# Patient Record
Sex: Female | Born: 1965 | Race: White | Hispanic: No | Marital: Single | Smoking: Former smoker
Health system: Southern US, Community
[De-identification: ages and names within clinical notes are randomized; demographics above are authoritative.]

## PROBLEM LIST (undated history)

## (undated) DIAGNOSIS — K449 Diaphragmatic hernia without obstruction or gangrene: Secondary | ICD-10-CM

## (undated) DIAGNOSIS — B029 Zoster without complications: Secondary | ICD-10-CM

## (undated) DIAGNOSIS — B019 Varicella without complication: Secondary | ICD-10-CM

## (undated) DIAGNOSIS — F32A Depression, unspecified: Secondary | ICD-10-CM

## (undated) DIAGNOSIS — G43909 Migraine, unspecified, not intractable, without status migrainosus: Secondary | ICD-10-CM

## (undated) DIAGNOSIS — L409 Psoriasis, unspecified: Secondary | ICD-10-CM

## (undated) DIAGNOSIS — F329 Major depressive disorder, single episode, unspecified: Secondary | ICD-10-CM

## (undated) DIAGNOSIS — S93401A Sprain of unspecified ligament of right ankle, initial encounter: Secondary | ICD-10-CM

## (undated) DIAGNOSIS — Z1239 Encounter for other screening for malignant neoplasm of breast: Secondary | ICD-10-CM

## (undated) DIAGNOSIS — R102 Pelvic and perineal pain: Secondary | ICD-10-CM

## (undated) DIAGNOSIS — Z1231 Encounter for screening mammogram for malignant neoplasm of breast: Principal | ICD-10-CM

## (undated) HISTORY — DX: Varicella without complication: B01.9

## (undated) HISTORY — DX: Zoster without complications: B02.9

## (undated) HISTORY — DX: Depression, unspecified: F32.A

## (undated) HISTORY — DX: Migraine, unspecified, not intractable, without status migrainosus: G43.909

## (undated) HISTORY — DX: Psoriasis, unspecified: L40.9

## (undated) HISTORY — DX: Diaphragmatic hernia without obstruction or gangrene: K44.9

## (undated) HISTORY — DX: Major depressive disorder, single episode, unspecified: F32.9

## (undated) HISTORY — PX: LAPAROSCOPIC GASTRIC BANDING: SHX1100

## (undated) HISTORY — PX: LAPAROSCOPY: SHX197

---

## 2013-09-27 ENCOUNTER — Encounter

## 2013-10-07 ENCOUNTER — Encounter

## 2014-03-13 ENCOUNTER — Inpatient Hospital Stay: Admit: 2014-03-13 | Payer: BLUE CROSS/BLUE SHIELD | Primary: Rheumatology

## 2014-03-13 ENCOUNTER — Encounter

## 2014-03-13 DIAGNOSIS — M7989 Other specified soft tissue disorders: Secondary | ICD-10-CM

## 2014-04-20 ENCOUNTER — Ambulatory Visit
Admit: 2014-04-20 | Discharge: 2014-04-20 | Payer: PRIVATE HEALTH INSURANCE | Attending: Specialist | Primary: Rheumatology

## 2014-04-20 DIAGNOSIS — S6010XA Contusion of unspecified finger with damage to nail, initial encounter: Secondary | ICD-10-CM

## 2014-04-20 MED ORDER — HYDROCODONE-ACETAMINOPHEN 7.5 MG-325 MG TAB
ORAL_TABLET | Freq: Every evening | ORAL | Status: DC | PRN
Start: 2014-04-20 — End: 2014-05-11

## 2014-04-20 NOTE — Progress Notes (Signed)
Patient: Martha PennerMary Porter                MRN: [id number]       SSN: [id number]  Date of Birth: 09/07/1965        AGE: 48 y.o.        SEX: female      PCP: Galen ManilaZachary Adams, MD  04/20/2014    Chief Complaint   Patient presents with   ??? Finger Pain     rt middle finger       HISTORY:  Martha PennerMary Porter is a 48 y.o. female who is seen for right middle finger pain. The injury occurred when she slammed her finger in a car door.  [facility] right [facility].     She has psoriasis but no psoriatic arthritis.    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  She is a history Runner, broadcasting/film/videoteacher.  [place] in [place] friend. She is right handed.   Last 3 Recorded Weights in this Encounter    04/20/14 0954   Weight: 200 lb (90.719 kg)     Body mass index is 36.57 kg/(m^2).    REVIEW OF SYSTEMS: All Below are Negative except: See HPI     Constitutional: negative for fever, chills, and weight loss.   Cardiovascular: negative for chest pain, claudication, leg swelling, SOB, DOE   Gastrointestinal: Negative for pain, N/V/C/D, Blood in stool or urine, dysuria,  hematuria, incontinence, pelvic pain.   Musculoskeletal: See HPI   Neurological: Negative for dizziness and weakness.   Negative for headaches, Visual changes, confusion, seizures   Phychiatric/Behavioral: Negative for depression, memory loss, substance  abuse.    Extremities: Negative for hair changes, rash, or skin lesion changes.   Hematologic: Negative for bleeding problems, bruising, pallor or swollen lymph  nodes   Peripheral Vascular: No calf pain, no circulation deficits.    History     Social History   ??? Marital Status: DIVORCED     Spouse Name: N/A     Number of Children: N/A   ??? Years of Education: N/A     Occupational History   ??? Not on file.     Social History Main Topics   ??? Smoking status: Former Smoker   ??? Smokeless tobacco: Not on file   ??? Alcohol Use: Yes   ??? Drug Use: Not on file    ??? Sexual Activity: Not on file     Other Topics Concern   ??? Not on file     Social History Narrative   ??? No narrative on file        Not on File     Current Outpatient Prescriptions   Medication Sig   ??? lisinopril (PRINIVIL, ZESTRIL) 20 mg tablet Take  by mouth daily.   ??? escitalopram oxalate (LEXAPRO) 20 mg tablet Take 20 mg by mouth daily.   ??? HYDROcodone-acetaminophen (NORCO) 7.5-325 mg per tablet Take 1-2 Tabs by mouth nightly as needed for Pain. Max Daily Amount: 2 Tabs.     No current facility-administered medications for this visit.        PHYSICAL EXAMINATION:  BP 134/74 mmHg   Pulse 66   Temp(Src) 97.1 ??F (36.2 ??C) (Oral)   Ht 5\' 2"  (1.575 m)   Wt 200 lb (90.719 kg)   BMI 36.57 kg/m2     ORTHO EXAMINATION:  Examination Right Hand   Skin Bruising  of the tip of the middle finger   Deformity -   Swelling -   Tenderness -   Finger flexion Full   Finger extension Full   Sensation Normal   Capillary refill -   Heberden's nodes -   Dupuytren's -       RADIOGRAPHS: Distal phalanx fracture of the right middle finger 1/3 distal portion of the DIP joint.     IMPRESSION:      ICD-10-CM ICD-9-CM    1. Subungual hematoma of finger of right hand, initial encounter S60.10XA 923.3 HYDROcodone-acetaminophen (NORCO) 7.5-325 mg per tablet    middle    2. Mallet deformity of third finger, right M20.011 736.1 HYDROcodone-acetaminophen (NORCO) 7.5-325 mg per tablet   3. Fracture of finger, distal phalanx, right, closed, initial encounter S62.639A 816.02 HYDROcodone-acetaminophen (NORCO) 7.5-325 mg per tablet       PLAN:  She will take Norco at night for the pain and will receive two link splints for her right middle finger bony mallet finger injury.  I will see her back in one month with re x ray.    Scribed by Beverly SessionsAllison Shaffer (Scribekick) as dictated by Tonia BroomsSteven C. Blake Vetrano, MD

## 2014-04-20 NOTE — Patient Instructions (Signed)
Mallet Finger: After Your Visit  Your Care Instructions  Mallet finger is a bent fingertip. It is caused by a fractured bone or torn tendon at the base of the finger joint near the fingertip. The injury can happen when a finger is bent with force, such as when trying to catch a ball and the fingertip is struck by the ball. It also is called baseball finger or drop finger.  Treatment includes wearing a splint for several weeks to keep the finger straight. Surgery may be needed if pieces of bone break off during the injury or if treatment with the splint does not work. Usually only a splint is needed.  It is very important that you wear and take care of the splint exactly as your doctor tells you to so that your finger heals properly and is no longer bent. Wearing a splint may interfere with your normal activities. Ask for help with daily tasks if you need it.  Follow-up care is a key part of your treatment and safety. Be sure to make and go to all appointments, and call your doctor if you are having problems. It's also a good idea to know your test results and keep a list of the medicines you take.  How can you care for yourself at home?  ?? If your doctor put a splint on your finger, wear the splint exactly as directed. Do not remove it until your doctor says that you can.  ?? Keep your hand raised above the level of your heart as much as you can. This will help reduce swelling.  ?? Put ice or a cold pack on your finger for 10 to 20 minutes at a time. Try to do this every 1 to 2 hours for the next 3 days (when you are awake) or until the swelling goes down. Put a thin cloth between the ice and your skin. Keep the splint dry.  ?? Take pain medicines exactly as directed.  ?? If the doctor gave you a prescription medicine for pain, take it as prescribed.  ?? If you are not taking a prescription pain medicine, ask your doctor if you can take an over-the-counter medicine.  When should you call for help?   Call your doctor now or seek immediate medical care if:  ?? Your finger is cool or pale or changes color.  ?? Your pain gets much worse.  ?? You have tingling or numbness in your finger.  ?? You have signs of infection. These include:  ?? Increased pain, swelling, warmth, or redness.  ?? Red streaks leading from the area.  ?? Pus draining from the area.  ?? Swollen lymph nodes in the armpits.  ?? A fever.  Watch closely for changes in your health, and be sure to contact your doctor if:  ?? You have more pain and swelling than your doctor told you to expect.   Where can you learn more?   Go to [email or web address]://www.healthwise.net/[email or web address]  Enter [phone] [id number] in the search box to learn more about "Mallet Finger: After Your Visit."   ?? 2006-2015 Healthwise, Incorporated. Care instructions adapted under license by [facility] [employer] (which disclaims liability or warranty for this information). This care instruction is for use with your licensed healthcare professional. If you have questions about a medical condition or this instruction, always ask your healthcare professional. Healthwise, Incorporated disclaims any warranty or liability for your use of this information.  Content Version: 10.5.422740; Current as of: March 25, 2013

## 2014-04-20 NOTE — Progress Notes (Signed)
Chief Complaint   Patient presents with   ??? Finger Pain     rt middle finger   pain 6/10

## 2014-05-11 ENCOUNTER — Ambulatory Visit
Admit: 2014-05-11 | Discharge: 2014-05-11 | Payer: PRIVATE HEALTH INSURANCE | Attending: Surgical | Primary: Rheumatology

## 2014-05-11 DIAGNOSIS — M79644 Pain in right finger(s): Secondary | ICD-10-CM

## 2014-05-11 MED ORDER — HYDROCODONE-ACETAMINOPHEN 7.5 MG-325 MG TAB
ORAL_TABLET | Freq: Every evening | ORAL | Status: AC | PRN
Start: 2014-05-11 — End: ?

## 2014-05-11 NOTE — Progress Notes (Signed)
Patient: Martha Porter                MRN: [id number]       SSN: [id number]  Date of Birth: 08/30/1965        AGE: 48 y.o.        SEX: female      PCP: Galen ManilaZachary Adams, MD  05/11/2014    Chief Complaint   Patient presents with   ??? Hand Pain     Right       HISTORY:  Martha PennerMary Porter is a 48 y.o. female who is seen for right middle finger pain. The injury occurred when she slammed her finger in a car door.  [facility].     She has psoriasis but no psoriatic arthritis.    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  [place] in [place] a friend. She is right handed.   Last 3 Recorded Weights in this Encounter    05/11/14 0929   Weight: 200 lb (90.719 kg)     Body mass index is 36.57 kg/(m^2).    REVIEW OF SYSTEMS: All Below are Negative except: See HPI     Constitutional: negative for fever, chills, and weight loss.   Cardiovascular: negative for chest pain, claudication, leg swelling, SOB, DOE   Gastrointestinal: Negative for pain, N/V/C/D, Blood in stool or urine, dysuria,  hematuria, incontinence, pelvic pain.   Musculoskeletal: See HPI   Neurological: Negative for dizziness and weakness.   Negative for headaches, Visual changes, confusion, seizures   Phychiatric/Behavioral: Negative for depression, memory loss, substance  abuse.    Extremities: Negative for hair changes, rash, or skin lesion changes.   Hematologic: Negative for bleeding problems, bruising, pallor or swollen lymph  nodes   Peripheral Vascular: No calf pain, no circulation deficits.    History     Social History   ??? Marital Status: DIVORCED     Spouse Name: N/A     Number of Children: N/A   ??? Years of Education: N/A     Occupational History   ??? Not on file.     Social History Main Topics   ??? Smoking status: Former Smoker   ??? Smokeless tobacco: Not on file   ??? Alcohol Use: Yes   ??? Drug Use: Not on file    ??? Sexual Activity: Not on file     Other Topics Concern   ??? Not on file     Social History Narrative        No Known Allergies     Current Outpatient Prescriptions   Medication Sig   ??? HYDROcodone-acetaminophen (NORCO) 7.5-325 mg per tablet Take 1-2 Tabs by mouth nightly as needed for Pain. Max Daily Amount: 2 Tabs.   ??? lisinopril (PRINIVIL, ZESTRIL) 20 mg tablet Take  by mouth daily.   ??? escitalopram oxalate (LEXAPRO) 20 mg tablet Take 20 mg by mouth daily.     No current facility-administered medications for this visit.        PHYSICAL EXAMINATION:  See below     ORTHO EXAMINATION:  Examination Right Hand (long finger)   Skin Bruising of the tip of the middle finger at dip with dry skin noted dorsally   Deformity -   Swelling -   Tenderness -   Finger flexion (P) at IP 70 degrees and 0 at DIP  Finger extension Full   Sensation Normal   Capillary refill -   Heberden's nodes -   Dupuytren's -       RADIOGRAPHS: Distal phalanx fracture of the right middle finger 1/3 distal portion of the DIP joint.     IMPRESSION:      ICD-10-CM ICD-9-CM    1. Pain in finger of right hand M79.644 729.5 AMB POC XRAY, HAND; 3+ VIEWS   2. Subungual hematoma of finger of right hand, initial encounter S60.10XA 923.3 HYDROcodone-acetaminophen (NORCO) 7.5-325 mg per tablet    middle    3. Mallet deformity of third finger, right M20.011 736.1 HYDROcodone-acetaminophen (NORCO) 7.5-325 mg per tablet   4. Fracture of finger, distal phalanx, right, closed, initial encounter S62.639A 816.02 HYDROcodone-acetaminophen (NORCO) 7.5-325 mg per tablet       PLAN:  Continue Norco and link splint x 3 weeks

## 2014-05-26 ENCOUNTER — Encounter: Attending: Surgical | Primary: Rheumatology

## 2014-09-06 ENCOUNTER — Encounter: Payer: Self-pay | Admitting: Family

## 2014-09-06 ENCOUNTER — Ambulatory Visit (INDEPENDENT_AMBULATORY_CARE_PROVIDER_SITE_OTHER): Payer: BC Managed Care – PPO | Admitting: Family

## 2014-09-06 VITALS — BP 138/94 | HR 82 | Temp 98.6°F | Resp 18 | Ht 62.0 in | Wt 201.0 lb

## 2014-09-06 DIAGNOSIS — F329 Major depressive disorder, single episode, unspecified: Secondary | ICD-10-CM

## 2014-09-06 DIAGNOSIS — I10 Essential (primary) hypertension: Secondary | ICD-10-CM | POA: Diagnosis not present

## 2014-09-06 DIAGNOSIS — R350 Frequency of micturition: Secondary | ICD-10-CM

## 2014-09-06 DIAGNOSIS — F32A Depression, unspecified: Secondary | ICD-10-CM | POA: Insufficient documentation

## 2014-09-06 LAB — POCT URINALYSIS DIPSTICK
Bilirubin, UA: NEGATIVE
Blood, UA: NEGATIVE
GLUCOSE UA: NEGATIVE
Ketones, UA: NEGATIVE
Leukocytes, UA: NEGATIVE
Nitrite, UA: NEGATIVE
Protein, UA: NEGATIVE
SPEC GRAV UA: 1.025
UROBILINOGEN UA: NEGATIVE
pH, UA: 6

## 2014-09-06 MED ORDER — MIRABEGRON ER 25 MG PO TB24: 25.0000 mg | ORAL_TABLET | Freq: Every day | ORAL | Status: DC

## 2014-09-06 NOTE — Patient Instructions (Signed)
Thank you for choosing [facility].  Summary/Instructions:  Your prescription(s) have been submitted to your pharmacy or been printed and provided for you. Please take as directed and contact our office if you believe you are having problem(s) with the medication(s) or have any questions.  Please stop by the lab on the basement level of the building for your blood work. Your results will be released to MyChart (or called to you) after review, usually within 72 hours after test completion. If any changes need to be made, you will be notified at that same time.  If your symptoms worsen or fail to improve, please contact our office for further instruction, or in case of emergency go directly to the emergency room at the closest medical facility.    Overactive Bladder The bladder has two functions that are totally opposite of the other. One is to relax and stretch out so it can store urine (fills like a balloon), and the other is to contract and squeeze down so that it can empty the urine that it has stored. Proper functioning of the bladder is a complex mixing of these two functions. The filling and emptying of the bladder can be influenced by: 1. The bladder. 2. The spinal cord. 3. The brain. 4. The nerves going to the bladder. 5. Other organs that are closely related to the bladder such as prostate in males and the vagina in females. As your bladder fills with urine, nerve signals are sent from the bladder to the brain to tell you that you may need to urinate. Normal urination requires that the bladder squeeze down with sufficient strength to empty the bladder, but this also requires that the bladder squeeze down sufficiently long to finish the job. In addition the sphincter muscles, which normally keep you from leaking urine, must also relax so that the urine can pass. Coordination between the bladder muscle squeezing down and the sphincter muscles relaxing is required to make everything  happen normally. With an overactive bladder sometimes the muscles of the bladder contract unexpectedly and involuntarily and this causes an urgent need to urinate. The normal response is to try to hold urine in by contracting the sphincter muscles. Sometimes the bladder contracts so strongly that the sphincter muscles cannot stop the urine from passing out and incontinence occurs. This kind of incontinence is called urge incontinence. Having an overactive bladder can be embarrassing and awkward. It can keep you from living life the way you want to. Many people think it is just something you have to put up with as you grow older or have certain health conditions. In fact, there are treatments that can help make your life easier and more pleasant. CAUSES  Many things can cause an overactive bladder. Possibilities include:  Urinary tract infection or infection of nearby tissues such as the prostate.  Prostate enlargement.  In women, multiple pregnancies or surgery on the uterus or urethra.  Bladder stones, inflammation, or tumors.  Caffeine.  Alcohol.  Medications. For example, diuretics (drugs that help the body get rid of extra fluid) increase urine production. Some other medicines must be taken with lots of fluids.  Muscle or nerve weakness. This might be the result of a spinal cord injury, a stroke, multiple sclerosis, or Parkinson disease.  Diabetes can cause a high urine volume which fills the bladder so quickly that the normal urge to urinate is triggered very strongly. SYMPTOMS   Loss of bladder control. You feel the need to urinate and  cannot make your body wait.  Sudden, strong urges to urinate.  Urinating 8 or more times a day.  Waking up to urinate two or more times a night. DIAGNOSIS  To decide if you have overactive bladder, your health care provider will probably:  Ask about symptoms you have noticed.  Ask about your overall health. This will include questions about  any medications you are taking.  Do a physical examination. This will help determine if there are obvious blockages or other problems.  Order some tests. These might include:  A blood test to check for diabetes or other health issues that could be contributing to the problem.  Urine testing. This could measure the flow of urine and the pressure on the bladder.  A test of your neurological system (the brain, spinal cord, and nerves). This is the system that senses the need to urinate. Some of these tests are called flow tests, bladder pressure tests, and electrical measurements of the sphincter muscle.  A bladder test to check whether it is emptying completely when you urinate.  Cystoscopy. This test uses a thin tube with a tiny camera on it. It offers a look inside your urethra and bladder to see if there are problems.  Imaging tests. You might be given a contrast dye and then asked to urinate. X-rays are taken to see how your bladder is working. TREATMENT  An overactive bladder can be treated in many ways. The treatment will depend on the cause. Whether you have a mild or severe case also makes a difference. Often, treatment can be given in your health care provider's office or clinic. Be sure to discuss the different options with your caregiver. They include:  Behavioral treatments. These do not involve medication or surgery:  Bladder training. For this, you would follow a schedule to urinate at regular intervals. This helps you learn to control the urge to urinate. At first, you might be asked to wait a few minutes after feeling the urge. In time, you should be able to schedule bathroom visits an hour or more apart.  Kegel exercises. These exercises strengthen the pelvic floor muscles, which support the bladder. Toning these muscles can help control urination even if the bladder muscles are overactive. A specialist will teach you how to do these exercises correctly. They will require daily  practice.  Weight loss. If you are obese or overweight, losing weight might stop your bladder from being overactive. Talk to your health care provider about how many pounds you should lose. Also ask if there is a specific program or method that would work best for you.  Diet change. This might be suggested if constipation is making your overactive bladder worse. Your health care provider or a nutritionist can explain ways to change what you eat to ease constipation. Other people might need to take in less caffeine or alcohol. Sometimes drinking fewer fluids is needed, too.  Protection. This is not an actual treatment. But, you could wear special pads to take care of any leakage while you wait for other treatments to take effect. This will help you avoid embarrassment.  Physical treatments.  Electrical stimulation. Electrodes will send gentle pulses to the nerves or muscles that help control the bladder. The goal is to strengthen them. Sometimes this is done with the electrodes outside the body. Or, they might be placed inside the body (implanted). This treatment can take several months to have an effect.  Medications. These are usually used along with other  treatments. Several medicines are available. Some are injected into the muscles involved in urination. Others come in pill form. Medications sometimes prescribed include:  Anticholinergics. These drugs block the signals that the nerves deliver to the bladder. This keeps it from releasing urine at the wrong time. Researchers think the drugs might help in other ways, too.  Imipramine. This is an antidepressant. But, it relaxes bladder muscles.  Botox. This is still experimental. Some people believe that injecting it into the bladder muscles will relax them so they work more normally. It has also been injected into the sphincter muscle when the sphincter muscle does not open properly. This is a temporary fix, however. Also, it might make matters  worse, especially in older people.  Surgery.  A device might be implanted to help manage your nerves. It works on the nerves that signal when you need to urinate.  Surgery is sometimes needed with electrical stimulation. If the electrodes are implanted, this is done through surgery.  Sometimes repairs need to be made through surgery. For example, the size of the bladder can be changed. This is usually done in severe cases only. HOME CARE INSTRUCTIONS   Take any medications your health care provider prescribed or suggested. Follow the directions carefully.  Practice any lifestyle changes that are recommended. These might include:  Drinking less fluid or drinking at different times of the day. If you need to urinate often during the night, for example, you may need to stop drinking fluids early in the evening.  Cutting down on caffeine or alcohol. They can both make an overactive bladder worse. Caffeine is found in coffee, tea, and sodas.  Doing Kegel exercises to strengthen muscles.  Losing weight, if that is recommended.  Eating a healthy and balanced diet. This will help you avoid constipation.  Keep a journal or a log. You might be asked to record how much you drink and when, and also when you feel the need to urinate.  Learn how to care for implants or other devices, such as pessaries. SEEK MEDICAL CARE IF:   Your overactive bladder gets worse.  You feel increased pain or irritation when you urinate.  You notice blood in your urine.  You have questions about any medications or devices that your health care provider recommended.  You notice blood, pus, or swelling at the site of any test or treatment procedure.  You have an oral temperature above 102F (38.9C). SEEK IMMEDIATE MEDICAL CARE IF:  You have an oral temperature above 102F (38.9C), not controlled by medicine. Document Released: 02/22/2009 Document Revised: 09/12/2013 Document Reviewed: 02/22/2009 [facility]  Patient Information 2015 [place], [place]. This information is not intended to replace advice given to you by your health care provider. Make sure you discuss any questions you have with your health care provider.  Kegel Exercises The goal of Kegel exercises is to isolate and exercise your pelvic floor muscles. These muscles act as a hammock that supports the rectum, vagina, small intestine, and uterus. As the muscles weaken, the hammock sags and these organs are displaced from their normal positions. Kegel exercises can strengthen your pelvic floor muscles and help you to improve bladder and bowel control, improve sexual response, and help reduce many problems and some discomfort during pregnancy. Kegel exercises can be done anywhere and at any time. HOW TO PERFORM KEGEL EXERCISES 6. Locate your pelvic floor muscles. To do this, squeeze (contract) the muscles that you use when you try to stop the flow of urine.  You will feel a tightness in the vaginal area (women) and a tight lift in the rectal area (men and women). 7. When you begin, contract your pelvic muscles tight for 2-5 seconds, then relax them for 2-5 seconds. This is one set. Do 4-5 sets with a short pause in between. 8. Contract your pelvic muscles for 8-10 seconds, then relax them for 8-10 seconds. Do 4-5 sets. If you cannot contract your pelvic muscles for 8-10 seconds, try 5-7 seconds and work your way up to 8-10 seconds. Your goal is 4-5 sets of 10 contractions each day. Keep your stomach, buttocks, and legs relaxed during the exercises. Perform sets of both short and long contractions. Vary your positions. Perform these contractions 3-4 times per day. Perform sets while you are:   Lying in bed in the morning.  Standing at lunch.  Sitting in the late afternoon.  Lying in bed at night. You should do 40-50 contractions per day. Do not perform more Kegel exercises per day than recommended. Overexercising can cause muscle fatigue.  Continue these exercises for for at least 15-20 weeks or as directed by your caregiver. Document Released: 04/14/2012 Document Reviewed: 04/14/2012 [facility] Patient Information 2015 [place]. This information is not intended to replace advice given to you by your health care provider. Make sure you discuss any questions you have with your health care provider.

## 2014-09-06 NOTE — Addendum Note (Signed)
Addended by: Mercer PodWRENN, Autry Prust E on: 09/06/2014 01:22 PM   Modules accepted: Orders

## 2014-09-06 NOTE — Assessment & Plan Note (Signed)
Stable with current regimen of Lexapro. Continue current dosage of the Lexapro.

## 2014-09-06 NOTE — Assessment & Plan Note (Signed)
Symptomatic exam consistent with potential overactive bladder. Obtain UA. In office results are negative for nitrites, leukocytes and hematuria. Obtain A1c to rule out diabetes. Start Myrbetriq. Discussed Kegel exercises to improve stress incontinence. Information provided regarding stress incontinence. Follow-up in one month to determine effectiveness.

## 2014-09-06 NOTE — Progress Notes (Signed)
Pre visit review using our clinic review tool, if applicable. No additional management support is needed unless otherwise documented below in the visit note. 

## 2014-09-06 NOTE — Progress Notes (Signed)
Subjective:    Patient ID: Caitlin Lane, female    DOB: 1966-03-10, 49 y.o.   MRN: [id number]  Chief Complaint  Patient presents with  . Establish Care    having problems with urinary frequency and urgency, does have some leaking, x1 year    HPI:  Caitlin Lane is a 49 y.o. female with a PMH of hypertension, depression and urinary frequency who presents today for an office visit to establish care.   1) Urinary issues - Associated symptoms of urinary frequency, urgency and some stress incontinence has been going on for about a year. Denies any current dysuria or back pain.  Denies any modifying factors that make it better. States she voids multiple times per day up to about every 30 minutes. She has tried not drinking water to prevent going to the restroom. Her caffeine intake is reported to be minimal. She does note there is also minor leakage during sexual intercourse on occasion.   2) Depression - currently maintained and stable with lexapro. Denies any adverse effects of medications. Denies any suicidal ideations.   3) Hypertension - currently maintained on lisinopril. Indicates her blood pressure readings at home are good.   BP Readings from Last 3 Encounters:  09/06/14 138/94    No Known Allergies  No current outpatient prescriptions on file prior to visit.   No current facility-administered medications on file prior to visit.    Past Medical History  Diagnosis Date  . Hypertension   . Depression   . Psoriasis   . Chicken pox   . Shingles   . Migraine   . Urinary incontinence   . UTI (lower urinary tract infection)     Past Surgical History  Procedure Laterality Date  . Cesarean section    . Laparoscopy    . Laparoscopic gastric banding      Family History  Problem Relation Age of Onset  . Arthritis Mother   . Heart disease Mother   . Hypertension Mother   . Depression Mother   . Diabetes Mother   . Arthritis Father   . Lung cancer Father   . Heart disease  Father   . Diabetes Maternal Grandmother   . Diabetes Maternal Grandfather   . Healthy Paternal Grandmother   . Healthy Paternal Grandfather     History   Social History  . Marital Status: Single    Spouse Name: N/A  . Number of Children: 2  . Years of Education: 18+   Occupational History  . Teacher    Social History Main Topics  . Smoking status: Former Games developer  . Smokeless tobacco: Never Used  . Alcohol Use: 0.0 oz/week    0 Standard drinks or equivalent per week     Comment: occasional   . Drug Use: No  . Sexual Activity: Not on file   Other Topics Concern  . Not on file   Social History Narrative  . No narrative on file    Review of Systems  Constitutional: Negative for fever and chills.  Genitourinary: Positive for urgency and frequency. Negative for dysuria, hematuria and flank pain.  Psychiatric/Behavioral: Negative for suicidal ideas and dysphoric mood.      Objective:    BP 138/94 mmHg  Pulse 82  Temp(Src) 98.6 F (37 C) (Oral)  Resp 18  Ht  (1.575 m)  Wt 201 lb (91.173 kg)  BMI 36.75 kg/m2  SpO2 97% Nursing note and vital signs reviewed.  Physical Exam  Constitutional:  She is oriented to person, place, and time. She appears well-developed and well-nourished. No distress.  Cardiovascular: Normal rate, regular rhythm, normal heart sounds and intact distal pulses.   Pulmonary/Chest: Effort normal and breath sounds normal.  Neurological: She is alert and oriented to person, place, and time.  Skin: Skin is warm and dry.  Psychiatric: She has a normal mood and affect. Her behavior is normal. Judgment and thought content normal.       Assessment & Plan:

## 2014-09-06 NOTE — Assessment & Plan Note (Signed)
Blood pressure slightly elevated today over goal of 140/90. Patient indicates home readings are much lower usually. Continue current regimen and dosage of lisinopril.

## 2014-09-20 ENCOUNTER — Emergency Department (HOSPITAL_COMMUNITY)
Admission: EM | Admit: 2014-09-20 | Discharge: 2014-09-20 | Disposition: A | Discharge status: Home / Self Care | Payer: BC Managed Care – PPO | Attending: Emergency Medicine | Admitting: Emergency Medicine

## 2014-09-20 ENCOUNTER — Encounter (HOSPITAL_COMMUNITY): Payer: Self-pay | Admitting: *Deleted

## 2014-09-20 ENCOUNTER — Emergency Department (HOSPITAL_COMMUNITY): Payer: BC Managed Care – PPO

## 2014-09-20 DIAGNOSIS — Z87440 Personal history of urinary (tract) infections: Secondary | ICD-10-CM | POA: Diagnosis not present

## 2014-09-20 DIAGNOSIS — R05 Cough: Secondary | ICD-10-CM | POA: Diagnosis present

## 2014-09-20 DIAGNOSIS — Z872 Personal history of diseases of the skin and subcutaneous tissue: Secondary | ICD-10-CM | POA: Insufficient documentation

## 2014-09-20 DIAGNOSIS — F329 Major depressive disorder, single episode, unspecified: Secondary | ICD-10-CM | POA: Insufficient documentation

## 2014-09-20 DIAGNOSIS — J209 Acute bronchitis, unspecified: Secondary | ICD-10-CM | POA: Insufficient documentation

## 2014-09-20 DIAGNOSIS — Z87891 Personal history of nicotine dependence: Secondary | ICD-10-CM | POA: Insufficient documentation

## 2014-09-20 DIAGNOSIS — J40 Bronchitis, not specified as acute or chronic: Secondary | ICD-10-CM

## 2014-09-20 DIAGNOSIS — Z8659 Personal history of other mental and behavioral disorders: Secondary | ICD-10-CM | POA: Insufficient documentation

## 2014-09-20 DIAGNOSIS — I10 Essential (primary) hypertension: Secondary | ICD-10-CM | POA: Insufficient documentation

## 2014-09-20 DIAGNOSIS — Z8619 Personal history of other infectious and parasitic diseases: Secondary | ICD-10-CM | POA: Insufficient documentation

## 2014-09-20 DIAGNOSIS — Z3202 Encounter for pregnancy test, result negative: Secondary | ICD-10-CM | POA: Diagnosis not present

## 2014-09-20 DIAGNOSIS — G43909 Migraine, unspecified, not intractable, without status migrainosus: Secondary | ICD-10-CM | POA: Diagnosis not present

## 2014-09-20 LAB — CBC
HCT: 40.7 % (ref 36.0–46.0)
HEMOGLOBIN: 13.8 g/dL (ref 12.0–15.0)
MCH: 28.6 pg (ref 26.0–34.0)
MCHC: 33.9 g/dL (ref 30.0–36.0)
MCV: 84.3 fL (ref 78.0–100.0)
Platelets: 394 10*3/uL (ref 150–400)
RBC: 4.83 MIL/uL (ref 3.87–5.11)
RDW: 12.7 % (ref 11.5–15.5)
WBC: 9.9 10*3/uL (ref 4.0–10.5)

## 2014-09-20 LAB — BASIC METABOLIC PANEL
Anion gap: 8 (ref 5–15)
BUN: <5 mg/dL — ABNORMAL LOW (ref 6–20)
CHLORIDE: 104 mmol/L (ref 101–111)
CO2: 24 mmol/L (ref 22–32)
CREATININE: 0.96 mg/dL (ref 0.44–1.00)
Calcium: 9 mg/dL (ref 8.9–10.3)
GFR calc Af Amer: >60 mL/min (ref >60)
GFR calc non Af Amer: >60 mL/min (ref >60)
GLUCOSE: 125 mg/dL — AB (ref 70–99)
Potassium: 3 mmol/L — ABNORMAL LOW (ref 3.5–5.1)
Sodium: 136 mmol/L (ref 135–145)

## 2014-09-20 LAB — I-STAT TROPONIN, ED: Troponin i, poc: 0 ng/mL (ref 0.00–0.08)

## 2014-09-20 LAB — POC URINE PREG, ED: PREG TEST UR: NEGATIVE

## 2014-09-20 MED ORDER — SODIUM CHLORIDE 0.9 % IV BOLUS (SEPSIS)
1000.0000 mL | Freq: Once | INTRAVENOUS | Status: AC
  Administered 2014-09-20: 1000 mL via INTRAVENOUS

## 2014-09-20 MED ORDER — HYDROCODONE-HOMATROPINE 5-1.5 MG/5ML PO SYRP
5.0000 mL | ORAL_SOLUTION | Freq: Once | ORAL | Status: AC
Start: 2014-09-20 — End: 2014-09-20
  Administered 2014-09-20: 5 mL via ORAL
  Filled 2014-09-20: qty 5

## 2014-09-20 MED ORDER — IOHEXOL 350 MG/ML SOLN
100.0000 mL | Freq: Once | INTRAVENOUS | Status: AC | PRN
  Administered 2014-09-20: 100 mL via INTRAVENOUS

## 2014-09-20 MED ORDER — PREDNISONE 20 MG PO TABS
60.0000 mg | ORAL_TABLET | ORAL | Status: AC
  Administered 2014-09-20: 60 mg via ORAL
  Filled 2014-09-20: qty 3

## 2014-09-20 MED ORDER — SULFAMETHOXAZOLE-TRIMETHOPRIM 800-160 MG PO TABS: 1.0000 | ORAL_TABLET | Freq: Two times a day (BID) | ORAL | Status: AC

## 2014-09-20 MED ORDER — PREDNISONE 20 MG PO TABS: 60.0000 mg | ORAL_TABLET | Freq: Every day | ORAL | Status: AC

## 2014-09-20 MED ORDER — HYDROCODONE-HOMATROPINE 5-1.5 MG/5ML PO SYRP: 5.0000 mL | ORAL_SOLUTION | ORAL | Status: DC | PRN

## 2014-09-20 MED ORDER — ALBUTEROL SULFATE HFA 108 (90 BASE) MCG/ACT IN AERS
2.0000 | INHALATION_SPRAY | RESPIRATORY_TRACT | Status: DC
  Administered 2014-09-20: 2 via RESPIRATORY_TRACT
  Filled 2014-09-20: qty 6.7

## 2014-09-20 MED ORDER — SULFAMETHOXAZOLE-TRIMETHOPRIM 800-160 MG PO TABS
1.0000 | ORAL_TABLET | Freq: Once | ORAL | Status: AC
  Administered 2014-09-20: 1 via ORAL
  Filled 2014-09-20: qty 1

## 2014-09-20 NOTE — ED Notes (Signed)
Patient at CT at this time. 

## 2014-09-20 NOTE — ED Notes (Signed)
Pt states that she has had cough and back pain for 3 weeks. Pt states that she has been on antibiotics and mucous is now clear. Pt sates that she is tender to touch in left chest and hurts to cough.

## 2014-09-20 NOTE — Discharge Instructions (Signed)
As discussed, it is important that you follow up as soon as possible with your physician for continued management of your condition.  For the next 2 days, please use the provided albuterol inhaler every 4 hours, and in addition to the prescribed medication.  If you develop any new, or concerning changes in your condition, please return to the emergency department immediately.

## 2014-09-20 NOTE — ED Provider Notes (Signed)
CSN: [id number]     Arrival date & time 09/20/14  1434 History   First MD Initiated Contact with Patient 09/20/14 1853     Chief Complaint  Patient presents with  . Cough     (Consider location/radiation/quality/duration/timing/severity/associated sxs/prior Treatment) HPI Patient presents with concern of ongoing cough, chest pain. Cough began several weeks ago, has not improved in spite of using OTC analgesics, antibiotics. Over the past 2 days patient has also developed new left-sided chest pain, worse with inspiration, positioning. Pain is sharp, severe, nonradiating. No other chest pain, no nausea, vomiting, fever. Patient is generally well aside from history of hypertension. Patient was previously a smoker.  Past Medical History  Diagnosis Date  . Hypertension   . Depression   . Psoriasis   . Chicken pox   . Shingles   . Migraine   . Urinary incontinence   . UTI (lower urinary tract infection)    Past Surgical History  Procedure Laterality Date  . Cesarean section    . Laparoscopy    . Laparoscopic gastric banding     Family History  Problem Relation Age of Onset  . Arthritis Mother   . Heart disease Mother   . Hypertension Mother   . Depression Mother   . Diabetes Mother   . Arthritis Father   . Lung cancer Father   . Heart disease Father   . Diabetes Maternal Grandmother   . Diabetes Maternal Grandfather   . Healthy Paternal Grandmother   . Healthy Paternal Grandfather    History  Substance Use Topics  . Smoking status: Former Games developermoker  . Smokeless tobacco: Never Used  . Alcohol Use: 0.0 oz/week    0 Standard drinks or equivalent per week     Comment: occasional    OB History    No data available     Review of Systems  Constitutional:       Per HPI, otherwise negative  HENT:       Per HPI, otherwise negative  Respiratory:       Per HPI, otherwise negative  Cardiovascular:       Per HPI, otherwise negative  Gastrointestinal: Negative for  vomiting.  Endocrine:       Negative aside from HPI  Genitourinary:       Neg aside from HPI   Musculoskeletal:       Per HPI, otherwise negative  Skin: Negative.   Neurological: Negative for syncope.      Allergies  Review of patient's allergies indicates no known allergies.  Home Medications   Prior to Admission medications   Medication Sig Start Date End Date Taking? Authorizing Provider  calcipotriene-betamethasone (TACLONEX) ointment Apply 1 application topically daily.   Yes Historical Provider, MD  escitalopram (LEXAPRO) 20 MG tablet Take 20 mg by mouth daily.   Yes Historical Provider, MD  lisinopril (PRINIVIL,ZESTRIL) 10 MG tablet Take 10 mg by mouth daily.   Yes Historical Provider, MD  mirabegron ER (MYRBETRIQ) 25 MG TB24 tablet Take 1 tablet (25 mg total) by mouth daily. 09/06/14  Yes Veryl SpeakGregory D Calone, FNP   BP 122/81 mmHg  Pulse 81  Temp(Src) 98 F (36.7 C) (Oral)  Resp 19  SpO2 99%  LMP 09/14/2014 Physical Exam  Constitutional: She is oriented to person, place, and time. She appears well-developed and well-nourished. No distress.  HENT:  Head: Normocephalic and atraumatic.  Eyes: Conjunctivae and EOM are normal.  Cardiovascular: Normal rate and regular rhythm.   Pulmonary/Chest: Effort  normal and breath sounds normal. No stridor. No respiratory distress.  Abdominal: She exhibits no distension.  Musculoskeletal: She exhibits no edema.  Neurological: She is alert and oriented to person, place, and time. No cranial nerve deficit.  Skin: Skin is warm and dry.  Psychiatric: She has a normal mood and affect.  Nursing note and vitals reviewed.   ED Course  Procedures (including critical care time) Labs Review Labs Reviewed  BASIC METABOLIC PANEL - Abnormal; Notable for the following:    Potassium 3.0 (*)    Glucose, Bld 125 (*)    BUN <5 (*)    All other components within normal limits  CBC  I-STAT TROPOININ, ED  POC URINE PREG, ED    Imaging  Review Dg Chest 2 View  09/20/2014   CLINICAL DATA:  Congestion and cough. Shortness of breath. Duration: 3 weeks. Left anterior chest pain. Former smoker.  EXAM: CHEST  2 VIEW  COMPARISON:  None.  FINDINGS: 0.7 by 0.5 cm nodular density projecting over the left mid chest on the frontal projection, sharply defined. No definite calcified hilar or mediastinal lymph nodes are identified.  Borderline central airway thickening. No airspace opacity. Minimal thoracic spondylosis.  Gastric band is in place, 2 o'clock-8 o'clock orientation.  IMPRESSION: 1. Average size 6 mm nodular density projecting over the left mid lung. Differential diagnostic considerations include a true pulmonary nodule ; a sclerotic lesion anteriorly in the left third rib; or a benign calcified granuloma. No prior chest imaging for comparison. No definite calcified lymph nodes to further favor this being a granuloma. Given the history of smoking, chest CT (noncontrast is fine for this nodule workup although if clinically warranted in the workup of the patient's shortness of breath and chest pain, pulmonary embolus protocol chest CT a could also be utilized) is recommended for further characterization specifically to exclude a neoplastic pulmonary nodule. 2. Airway thickening is present, suggesting bronchitis or reactive airways disease.   Electronically Signed   By: Gaylyn RongWalter  Liebkemann M.D.   On: 09/20/2014 17:05   Ct Angio Chest Pe W/cm &/or Wo Cm  09/20/2014   CLINICAL DATA:  Cough and back pain for 3 weeks.  EXAM: CT ANGIOGRAPHY CHEST WITH CONTRAST  TECHNIQUE: Multidetector CT imaging of the chest was performed using the standard protocol during bolus administration of intravenous contrast. Multiplanar CT image reconstructions and MIPs were obtained to evaluate the vascular anatomy.  CONTRAST:  100mL OMNIPAQUE IOHEXOL 350 MG/ML SOLN  COMPARISON:  Radiographs 09/20/2014  FINDINGS: The nodular opacity in the left chest observed on recent  radiography is attributable to a benign focus of calcification in the left anterior third rib cartilage at the costochondral junction. There is no pulmonary nodule.  Mild ground-glass opacities are scattered bilaterally. This could represent scattered air trapping. There is no dense airspace consolidation. The airways are patent. There are no effusions. There is no hilar or mediastinal adenopathy although a few physiologic -sized nodes are present, perhaps reactive. Pulmonary vasculature is well opacified with no evidence of a pulmonary embolism. The thoracic aorta is normal in caliber and intact.  Included portions of the upper abdomen demonstrate an adjustable gastric band but no significant upper abdominal abnormality. Small hiatal hernia incidentally noted.  Review of the MIP images confirms the above findings.  IMPRESSION: 1. Benign calcification at the left anterior third costochondral junction accounts for the nodular opacity observed on recent radiography 2. Mild scattered ground-glass opacities in both lungs, perhaps due to air trapping. Infectious  or inflammatory etiologies are not excluded. 3. Small hiatal hernia. 4. Negative for pulmonary embolism   Electronically Signed   By: Ellery Plunk M.D.   On: 09/20/2014 21:40     EKG Interpretation   Date/Time:  Wednesday Sep 20 2014 15:37:47 EDT Ventricular Rate:  92 PR Interval:  134 QRS Duration: 76 QT Interval:  368 QTC Calculation: 455 R Axis:   18 Text Interpretation:  Normal sinus rhythm Nonspecific T wave abnormality  Abnormal ECG Sinus rhythm T wave abnormality Artifact Abnormal ekg  Confirmed by Gerhard Munch  MD [phone]) on 09/20/2014 7:04:08 PM     With abnormal x-ray results, history of pleuritic chest pain, patient will have CT scan performed.  I reviewed the results (including imaging as performed), agree with the interpretation  On repeat exam the patient appears better.  We reviewed all findings.   MDM   Patient  presents with ongoing cough, pain, no relief in spite of taking a complete course of antibiotics. Given the patient's abnormal x-ray, she did have CT chest for further evaluation. CT chest was largely reassuring, though there is evidence for inflammatory, possibly subtle infectious pathology. No evidence for sepsis or bacteremia, via labs, and patient had improvement here with initiation of therapy. Patient started on a course of medication, will follow up with primary care for further evaluation, management.    Gerhard Munch, MD 09/20/14 2215

## 2014-09-20 NOTE — ED Notes (Signed)
Called for triage x1 with no response. 

## 2014-09-27 ENCOUNTER — Encounter (HOSPITAL_COMMUNITY): Payer: Self-pay | Admitting: Family Medicine

## 2014-09-27 ENCOUNTER — Emergency Department (HOSPITAL_COMMUNITY): Payer: BC Managed Care – PPO

## 2014-09-27 ENCOUNTER — Emergency Department (HOSPITAL_COMMUNITY)
Admission: EM | Admit: 2014-09-27 | Discharge: 2014-09-27 | Disposition: A | Discharge status: Home / Self Care | Payer: BC Managed Care – PPO | Attending: Emergency Medicine | Admitting: Emergency Medicine

## 2014-09-27 DIAGNOSIS — Z87440 Personal history of urinary (tract) infections: Secondary | ICD-10-CM | POA: Diagnosis not present

## 2014-09-27 DIAGNOSIS — R0781 Pleurodynia: Secondary | ICD-10-CM

## 2014-09-27 DIAGNOSIS — I10 Essential (primary) hypertension: Secondary | ICD-10-CM | POA: Insufficient documentation

## 2014-09-27 DIAGNOSIS — Y9289 Other specified places as the place of occurrence of the external cause: Secondary | ICD-10-CM | POA: Insufficient documentation

## 2014-09-27 DIAGNOSIS — Z872 Personal history of diseases of the skin and subcutaneous tissue: Secondary | ICD-10-CM | POA: Diagnosis not present

## 2014-09-27 DIAGNOSIS — Y9389 Activity, other specified: Secondary | ICD-10-CM | POA: Diagnosis not present

## 2014-09-27 DIAGNOSIS — Z8619 Personal history of other infectious and parasitic diseases: Secondary | ICD-10-CM | POA: Insufficient documentation

## 2014-09-27 DIAGNOSIS — Z792 Long term (current) use of antibiotics: Secondary | ICD-10-CM | POA: Diagnosis not present

## 2014-09-27 DIAGNOSIS — Z79899 Other long term (current) drug therapy: Secondary | ICD-10-CM | POA: Insufficient documentation

## 2014-09-27 DIAGNOSIS — F329 Major depressive disorder, single episode, unspecified: Secondary | ICD-10-CM | POA: Insufficient documentation

## 2014-09-27 DIAGNOSIS — R059 Cough, unspecified: Secondary | ICD-10-CM

## 2014-09-27 DIAGNOSIS — Z87891 Personal history of nicotine dependence: Secondary | ICD-10-CM | POA: Insufficient documentation

## 2014-09-27 DIAGNOSIS — X58XXXA Exposure to other specified factors, initial encounter: Secondary | ICD-10-CM | POA: Diagnosis not present

## 2014-09-27 DIAGNOSIS — S299XXA Unspecified injury of thorax, initial encounter: Secondary | ICD-10-CM | POA: Insufficient documentation

## 2014-09-27 DIAGNOSIS — R05 Cough: Secondary | ICD-10-CM | POA: Diagnosis not present

## 2014-09-27 DIAGNOSIS — Y998 Other external cause status: Secondary | ICD-10-CM | POA: Insufficient documentation

## 2014-09-27 MED ORDER — NAPROXEN 500 MG PO TABS: 500.0000 mg | ORAL_TABLET | Freq: Two times a day (BID) | ORAL | Status: DC

## 2014-09-27 MED ORDER — HYDROCODONE-HOMATROPINE 5-1.5 MG/5ML PO SYRP: 5.0000 mL | ORAL_SOLUTION | Freq: Four times a day (QID) | ORAL | Status: DC | PRN

## 2014-09-27 MED ORDER — OXYCODONE-ACETAMINOPHEN 5-325 MG PO TABS
1.0000 | ORAL_TABLET | Freq: Once | ORAL | Status: AC
  Administered 2014-09-27: 1 via ORAL
  Filled 2014-09-27: qty 1

## 2014-09-27 NOTE — ED Notes (Signed)
p here for possible left rib fracture. sts she was in bed last night and coughed then heard a pop. sts hurts to cough, breathe, move.

## 2014-09-27 NOTE — ED Notes (Signed)
Pt returned from xray

## 2014-09-27 NOTE — ED Provider Notes (Signed)
CSN: [id number]     Arrival date & time 09/27/14  1714 History  This chart was scribed for  Garlon HatchetLisa M Amamda Curbow, PA-C working with Mancel BaleElliott Wentz, MD by Evon Slackerrance Branch, ED Scribe. This patient was seen in room TR07C/TR07C and the patient's care was started at 5:26 PM.     Chief Complaint  Patient presents with  . Rib Injury   The history is provided by the patient. No language interpreter was used.   HPI Comments: Caitlin Lane is a 49 y.o. female who presents to the Emergency Department complaining of left sided rib pain onset 1 night prior. Pt states last night she coughed and heard a pop. Pt states she believes the area is possibly swollen. Pt states that the pain is worse with coughing, deep breathing and movement. Pt doesn't report any medications PTA. Pt doesn't report any alleviating factors.  She has had a cough and was diagnosed with bronchitis approximately 1 week ago. She denies any fever or chills. No chest pain or shortness of breath.  No recent travel, lower extremity edema, calf pain, history of DVT or PE. She is not on any exogenous estrogens.  Past Medical History  Diagnosis Date  . Hypertension   . Depression   . Psoriasis   . Chicken pox   . Shingles   . Migraine   . Urinary incontinence   . UTI (lower urinary tract infection)    Past Surgical History  Procedure Laterality Date  . Cesarean section    . Laparoscopy    . Laparoscopic gastric banding     Family History  Problem Relation Age of Onset  . Arthritis Mother   . Heart disease Mother   . Hypertension Mother   . Depression Mother   . Diabetes Mother   . Arthritis Father   . Lung cancer Father   . Heart disease Father   . Diabetes Maternal Grandmother   . Diabetes Maternal Grandfather   . Healthy Paternal Grandmother   . Healthy Paternal Grandfather    History  Substance Use Topics  . Smoking status: Former Games developermoker  . Smokeless tobacco: Never Used  . Alcohol Use: 0.0 oz/week    0 Standard drinks or  equivalent per week     Comment: occasional    OB History    No data available     Review of Systems  Constitutional: Negative for fever.  Respiratory: Positive for cough.   Cardiovascular: Positive for chest pain (left sided rib pain).  All other systems reviewed and are negative.    Allergies  Review of patient's allergies indicates no known allergies.  Home Medications   Prior to Admission medications   Medication Sig Start Date End Date Taking? Authorizing Provider  escitalopram (LEXAPRO) 20 MG tablet Take 20 mg by mouth daily.    Historical Provider, MD  HYDROcodone-homatropine (HYCODAN) 5-1.5 MG/5ML syrup Take 5 mLs by mouth every 4 (four) hours as needed for cough. 09/20/14   Gerhard Munchobert Lockwood, MD  lisinopril (PRINIVIL,ZESTRIL) 10 MG tablet Take 10 mg by mouth daily.    Historical Provider, MD  mirabegron ER (MYRBETRIQ) 25 MG TB24 tablet Take 1 tablet (25 mg total) by mouth daily. 09/06/14   Veryl SpeakGregory D Calone, FNP  sulfamethoxazole-trimethoprim (BACTRIM DS,SEPTRA DS) 800-160 MG per tablet Take 1 tablet by mouth 2 (two) times daily. 09/20/14 09/27/14  Gerhard Munchobert Lockwood, MD   BP 118/73 mmHg  Pulse 85  Temp(Src) 98.3 F (36.8 C)  Resp 18  SpO2 100%  LMP 09/09/2014   Physical Exam  Constitutional: She is oriented to person, place, and time. She appears well-developed and well-nourished. No distress.  HENT:  Head: Normocephalic and atraumatic.  Eyes: Conjunctivae and EOM are normal.  Neck: Neck supple. No tracheal deviation present.  Cardiovascular: Normal rate.   Pulmonary/Chest: Effort normal. No respiratory distress. She exhibits tenderness and bony tenderness. She exhibits no edema, no deformity and no retraction.    TTP of left lower lateral ribs; no acute deformities or crepitus; lungs clear bilaterally  Musculoskeletal: Normal range of motion.  Neurological: She is alert and oriented to person, place, and time.  Skin: Skin is warm and dry.  Psychiatric: She has a  normal mood and affect. Her behavior is normal.  Nursing note and vitals reviewed.   ED Course  Procedures (including critical care time) DIAGNOSTIC STUDIES: Oxygen Saturation is 100% on RA, normal by my interpretation.    COORDINATION OF CARE: 5:40 PM-Discussed treatment plan with pt at bedside and pt agreed to plan.     Labs Review Labs Reviewed - No data to display  Imaging Review Dg Ribs Unilateral W/chest Left  09/27/2014   CLINICAL DATA:  Left lower rib pain beginning 09/26/2014. The patient heard a pop while coughing.  EXAM: LEFT RIBS AND CHEST - 3+ VIEW  COMPARISON:  CT chest and PA and lateral chest 09/20/2014.  FINDINGS: Mild elevation of the right hemidiaphragm relative to the left is unchanged. The lungs are clear. Heart size is normal. No pneumothorax or pleural effusion is identified. No rib fracture is seen. Lap band is noted.  IMPRESSION: Negative for fracture.  No acute disease.   Electronically Signed   By: Drusilla Kannerhomas  Dalessio M.D.   On: 09/27/2014 18:02     EKG Interpretation None      MDM   Final diagnoses:  Rib pain on left side  Cough   49 year old female with left rib pain. She was coughing last night and felt a "pop". States now she has left rib pain, worse with deep breathing, coughing, or moving. She denies any chest pain or shortness of breath. She has no history of DVT or PE and is PERC negative.  Some tenderness noted of left lateral ribs on exam without gross deformity.  Lungs clear bilaterally.  X-ray obtained without evidence of acute rib fracture, PTX, or infiltrate.  Patient's VS remain stable on RA.  Doubt ACS, PE, dissection, or other acute cardiac event at this time, more likely MSK.  Patient given incentive spirometer and instructed on use.  Rx naprosyn and hycodan.  FU with PCP.  Discussed plan with patient, he/she acknowledged understanding and agreed with plan of care.  Return precautions given for new or worsening symptoms.  I personally  performed the services described in this documentation, which was scribed in my presence. The recorded information has been reviewed and is accurate.  Garlon HatchetLisa M Hoorain Kozakiewicz, PA-C 09/27/14 1834  Mancel BaleElliott Wentz, MD 09/28/14 [phone]

## 2014-09-27 NOTE — ED Notes (Signed)
Pt given incentive spirometer, instructed on its use. Pt demonstrated how to use it.

## 2014-09-27 NOTE — Discharge Instructions (Signed)
Take the prescribed medication as directed.  Use incentive spirometer as shown in the ED. Follow-up with your primary care physician. Return to the ED for new or worsening symptoms.

## 2014-10-04 ENCOUNTER — Telehealth: Payer: Self-pay | Admitting: Family

## 2014-10-04 NOTE — Telephone Encounter (Signed)
Patient is requesting refill for lisinopril (PRINIVIL,ZESTRIL) 10 MG tablet [[id number]][[id number]] . Pharmacy is [facility] on [address] She was also in the ER on 5/18 for cough and they gave her a prescription for HYDROcodone-homatropine (HYCODAN) 5-1.5 MG/5ML syrup [[id number]][[id number]] and it worked really well for her. Are you able to give her another prescription for that

## 2014-10-05 MED ORDER — HYDROCODONE-HOMATROPINE 5-1.5 MG/5ML PO SYRP: 5.0000 mL | ORAL_SOLUTION | Freq: Four times a day (QID) | ORAL | Status: DC | PRN

## 2014-10-05 MED ORDER — LISINOPRIL 10 MG PO TABS: 10.0000 mg | ORAL_TABLET | Freq: Every day | ORAL | Status: DC

## 2014-10-05 NOTE — Telephone Encounter (Signed)
Medications refilled and available for pick up. 

## 2014-10-05 NOTE — Telephone Encounter (Signed)
Patient aware to pick up 

## 2014-10-17 ENCOUNTER — Ambulatory Visit: Payer: BC Managed Care – PPO | Admitting: Family

## 2014-10-17 ENCOUNTER — Telehealth: Payer: Self-pay | Admitting: Family

## 2014-10-17 MED ORDER — HYDROCODONE-HOMATROPINE 5-1.5 MG/5ML PO SYRP: 5.0000 mL | ORAL_SOLUTION | Freq: Four times a day (QID) | ORAL | Status: DC | PRN

## 2014-10-17 NOTE — Telephone Encounter (Signed)
Patient requesting prescription for HYDROcodone-homatropine (HYCODAN) 5-1.5 MG/5ML syrup [[id number]][[id number]] to help with her cough.

## 2014-10-17 NOTE — Telephone Encounter (Signed)
Left msg on  vm

## 2014-10-17 NOTE — Telephone Encounter (Signed)
Medication refilled and available for pick up. 

## 2014-11-10 ENCOUNTER — Encounter: Payer: Self-pay | Admitting: Internal Medicine

## 2014-11-10 ENCOUNTER — Ambulatory Visit (INDEPENDENT_AMBULATORY_CARE_PROVIDER_SITE_OTHER): Payer: BC Managed Care – PPO | Admitting: Internal Medicine

## 2014-11-10 VITALS — BP 144/92 | HR 84 | Temp 98.6°F | Ht 62.0 in | Wt 200.0 lb

## 2014-11-10 DIAGNOSIS — R938 Abnormal findings on diagnostic imaging of other specified body structures: Secondary | ICD-10-CM | POA: Diagnosis not present

## 2014-11-10 DIAGNOSIS — R0600 Dyspnea, unspecified: Secondary | ICD-10-CM | POA: Diagnosis not present

## 2014-11-10 DIAGNOSIS — I10 Essential (primary) hypertension: Secondary | ICD-10-CM

## 2014-11-10 DIAGNOSIS — R9389 Abnormal findings on diagnostic imaging of other specified body structures: Secondary | ICD-10-CM | POA: Insufficient documentation

## 2014-11-10 MED ORDER — PROAIR HFA 108 (90 BASE) MCG/ACT IN AERS
INHALATION_SPRAY | RESPIRATORY_TRACT | Status: DC
Start: 2014-11-10 — End: 2015-01-02

## 2014-11-10 MED ORDER — HYDROCODONE-HOMATROPINE 5-1.5 MG/5ML PO SYRP: 5.0000 mL | ORAL_SOLUTION | Freq: Four times a day (QID) | ORAL | Status: DC | PRN

## 2014-11-10 MED ORDER — PREDNISONE 10 MG PO TABS: 10.0000 mg | ORAL_TABLET | Freq: Every day | ORAL | Status: DC

## 2014-11-10 MED ORDER — BUDESONIDE-FORMOTEROL FUMARATE 160-4.5 MCG/ACT IN AERO: 2.0000 | INHALATION_SPRAY | Freq: Two times a day (BID) | RESPIRATORY_TRACT | Status: DC

## 2014-11-10 MED ORDER — METHYLPREDNISOLONE ACETATE 80 MG/ML IJ SUSP
80.0000 mg | Freq: Once | INTRAMUSCULAR | Status: AC
  Administered 2014-11-10: 80 mg via INTRAMUSCULAR

## 2014-11-10 MED ORDER — PREDNISONE 10 MG PO TABS
ORAL_TABLET | ORAL | Status: DC
Start: 2014-11-10 — End: 2015-01-02

## 2014-11-10 NOTE — Patient Instructions (Signed)
.  You had the steroid shot today  Please take all new medication as prescribed - the prednisone, cough med as needed, symbicort every day regularly, and the Proair HFA inhaler as needed  You will be contacted regarding the referral for: Pulmonary  Please continue all other medications as before, and refills have been done if requested.  Please have the pharmacy call with any other refills you may need.  Please keep your appointments with your specialists as you may have planned

## 2014-11-10 NOTE — Progress Notes (Signed)
Subjective:    Patient ID: Caitlin Lane, female    DOB: 1965/11/15, 49 y.o.   MRN: [id number]  HPI  Here with over 2 mo onset scant prod cough, dyspnea ? Wheezing intermittent, mild mostly, but wax and wane more severe such as last 3 days with incresaed cough and breathiness such that she cant sleep at night and has recurring pain now to left lateral chest sharp with cough.  No fever or other pain.  Pt denies fever, wt loss, night sweats, loss of appetite, or other constitutional symptoms.  Has seen at least 2 other providers, with 2 antibx, and may have improved somewhat but minimal and essentially now back to where she started.  CT chest may 2016 neg for PE, but has bilat ground glass abnormality unclear etiology.  Proair hfa has helped but only uses occasionally.  No recent unusual pets or travel Past Medical History  Diagnosis Date  . Hypertension   . Depression   . Psoriasis   . Chicken pox   . Shingles   . Migraine   . Urinary incontinence   . UTI (lower urinary tract infection)    Past Surgical History  Procedure Laterality Date  . Cesarean section    . Laparoscopy    . Laparoscopic gastric banding      reports that she has quit smoking. She has never used smokeless tobacco. She reports that she drinks alcohol. She reports that she does not use illicit drugs. family history includes Arthritis in her father and mother; Depression in her mother; Diabetes in her maternal grandfather, maternal grandmother, and mother; Healthy in her paternal grandfather and paternal grandmother; Heart disease in her father and mother; Hypertension in her mother; Lung cancer in her father. No Known Allergies Current Outpatient Prescriptions on File Prior to Visit  Medication Sig Dispense Refill  . escitalopram (LEXAPRO) 20 MG tablet Take 20 mg by mouth daily.    Marland Kitchen lisinopril (PRINIVIL,ZESTRIL) 10 MG tablet Take 1 tablet (10 mg total) by mouth daily. 30 tablet 2  . mirabegron ER (MYRBETRIQ) 25 MG TB24  tablet Take 1 tablet (25 mg total) by mouth daily. 30 tablet 0  . naproxen (NAPROSYN) 500 MG tablet Take 1 tablet (500 mg total) by mouth 2 (two) times daily with a meal. 30 tablet 0   No current facility-administered medications on file prior to visit.    Review of Systems  Constitutional: Negative for unusual diaphoresis or night sweats HENT: Negative for ringing in ear or discharge Eyes: Negative for double vision or worsening visual disturbance.  Respiratory: Negative for choking and stridor.   Gastrointestinal: Negative for vomiting or other signifcant bowel change Genitourinary: Negative for hematuria or change in urine volume.  Musculoskeletal: Negative for other MSK pain or swelling Skin: Negative for color change and worsening wound.  Neurological: Negative for tremors and numbness other than noted  Psychiatric/Behavioral: Negative for decreased concentration or agitation other than above       Objective:   Physical Exam BP 144/92 mmHg  Pulse 84  Temp(Src) 98.6 F (37 C) (Oral)  Ht  (1.575 m)  Wt 200 lb (90.719 kg)  BMI 36.57 kg/m2  SpO2 97%  LMP 11/07/2014 VS noted,  Constitutional: Pt appears in no significant distress HENT: Head: NCAT.  Right Ear: External ear normal.  Left Ear: External ear normal.  Eyes: . Pupils are equal, round, and reactive to light. Conjunctivae and EOM are normal Neck: Normal range of motion. Neck supple.  Cardiovascular: Normal rate and regular rhythm.   Pulmonary/Chest: Effort normal and breath sounds decreased without rales, breathy to talk but trace wheezing only  Abd:  Soft, NT, ND, + BS Neurological: Pt is alert. Not confused , motor grossly intact Skin: Skin is warm. No rash, no LE edema Psychiatric: Pt behavior is normal. No agitation.     Assessment & Plan:

## 2014-11-10 NOTE — Addendum Note (Signed)
Addended by: Anselm JunglingBONYUN-DEBOURGH, Marai Teehan M on: 11/10/2014 02:30 PM   Modules accepted: Orders

## 2014-11-10 NOTE — Progress Notes (Signed)
Pre visit review using our clinic review tool, if applicable. No additional management support is needed unless otherwise documented below in the visit note. 

## 2014-11-10 NOTE — Assessment & Plan Note (Addendum)
Hx most c/w new onset possibely seasaonal allergic related asthma vs other pneumoconiosis?? , afeb, ct may 2016 noted, ok for depomedrol IM, predpac asd, proair hfa refill prn use, and symbicort 160/4.5 asd

## 2014-11-10 NOTE — Assessment & Plan Note (Signed)
Also for referral pulmonary for f/u, consider any f/u needed

## 2014-11-10 NOTE — Assessment & Plan Note (Signed)
stable overall by history and exam, recent data reviewed with pt, and pt to continue medical treatment as before,  to f/u any worsening symptoms or concerns BP Readings from Last 3 Encounters:  11/10/14 144/92  09/27/14 119/70  09/20/14 118/77

## 2014-11-17 ENCOUNTER — Institutional Professional Consult (permissible substitution): Payer: BC Managed Care – PPO | Admitting: Internal Medicine

## 2014-11-22 ENCOUNTER — Telehealth: Payer: Self-pay | Admitting: Family

## 2014-11-22 NOTE — Telephone Encounter (Signed)
Patient was prescribed symbicort by Dr. Jonny RuizJohn and her copay on it is $40. She is wondering if you would have samples because she doesn't have the funds also, she's worried about spending $40 and it not helping her. Please advise

## 2014-11-23 MED ORDER — FLUTICASONE-SALMETEROL 100-50 MCG/DOSE IN AEPB: 1.0000 | INHALATION_SPRAY | Freq: Two times a day (BID) | RESPIRATORY_TRACT | Status: DC

## 2014-11-23 NOTE — Telephone Encounter (Signed)
Patient requests to have a generic and comparable drug to symbicort sent in rather than the discount card

## 2014-11-23 NOTE — Telephone Encounter (Signed)
Advair sent to pharmacy

## 2014-11-23 NOTE — Telephone Encounter (Signed)
There are two options that we can try. The first is that I have a discount card that is available to make the payment possibly $25 or we can try a different medication. Please let me know what the patient would like to do.

## 2014-11-23 NOTE — Telephone Encounter (Signed)
LMOVM to call back 

## 2014-11-24 ENCOUNTER — Institutional Professional Consult (permissible substitution): Payer: BC Managed Care – PPO | Admitting: Internal Medicine

## 2015-01-01 ENCOUNTER — Encounter: Payer: Self-pay | Admitting: Pulmonary Disease

## 2015-01-01 ENCOUNTER — Encounter (INDEPENDENT_AMBULATORY_CARE_PROVIDER_SITE_OTHER): Payer: Self-pay

## 2015-01-01 ENCOUNTER — Ambulatory Visit (INDEPENDENT_AMBULATORY_CARE_PROVIDER_SITE_OTHER): Payer: BC Managed Care – PPO | Admitting: Pulmonary Disease

## 2015-01-01 VITALS — BP 118/66 | HR 83 | Ht 62.0 in | Wt 199.6 lb

## 2015-01-01 DIAGNOSIS — R05 Cough: Secondary | ICD-10-CM | POA: Diagnosis not present

## 2015-01-01 DIAGNOSIS — L409 Psoriasis, unspecified: Secondary | ICD-10-CM | POA: Insufficient documentation

## 2015-01-01 DIAGNOSIS — R0600 Dyspnea, unspecified: Secondary | ICD-10-CM | POA: Diagnosis not present

## 2015-01-01 DIAGNOSIS — K449 Diaphragmatic hernia without obstruction or gangrene: Secondary | ICD-10-CM | POA: Diagnosis not present

## 2015-01-01 DIAGNOSIS — R059 Cough, unspecified: Secondary | ICD-10-CM

## 2015-01-01 DIAGNOSIS — Z8669 Personal history of other diseases of the nervous system and sense organs: Secondary | ICD-10-CM | POA: Insufficient documentation

## 2015-01-01 DIAGNOSIS — E669 Obesity, unspecified: Secondary | ICD-10-CM | POA: Insufficient documentation

## 2015-01-01 MED ORDER — BENZONATATE 100 MG PO CAPS: ORAL_CAPSULE | ORAL | Status: DC

## 2015-01-01 MED ORDER — RANITIDINE HCL 150 MG PO TABS: 150.0000 mg | ORAL_TABLET | Freq: Every day | ORAL | Status: DC

## 2015-01-01 MED ORDER — HYDROCODONE-HOMATROPINE 5-1.5 MG/5ML PO SYRP: 5.0000 mL | ORAL_SOLUTION | Freq: Every day | ORAL | Status: DC

## 2015-01-01 MED ORDER — HYDROCODONE-HOMATROPINE 5-1.5 MG/5ML PO SYRP: 5.0000 mL | ORAL_SOLUTION | Freq: Every evening | ORAL | Status: DC | PRN

## 2015-01-01 NOTE — Addendum Note (Signed)
Addended by: Tommie Sams on: 01/01/2015 11:00 AM   Modules accepted: Orders

## 2015-01-01 NOTE — Progress Notes (Signed)
Subjective:    Patient ID: Caitlin Lane, female    DOB: March 13, 1966, 49 y.o.   MRN: [id number]  HPI She reports in May 2016 she had a "cold".  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 She doesn't recall a sore throat. It started as sinus congestion & drainage. She then developed a cough and chest discomfort with her coughing. She has been treated with multiple courses of antibiotics. She reports she continues to have intermittent coughing and pain in her "ribs" with her "coughing spells". She reports that her cough has never totally resolved.  She continues to have production of a "green, sticky, thick" mucus. She does was up frequently at night coughing. She does feel a "tickle" in the back of her throat that triggers her cough at times. She reports she has coughing with increased heat & exertion. She continues to have sinus drainage that is clear. She denies any hemoptysis. She reports dyspnea on exertion. She denies any audible wheezing but does have a "gurgling" in her throat at times. She feels she coughs more at night when recumbent. No reflux, dyspepsia or morning brash water taste. She does have dysphagia at times that she attributes to her lap band. No fever or chills. She does have sweats at times with her coughing spells only. She does have some incontinence with her coughing spells. No rashes or bruising. She reports no history of allergies or breathing problems as a child. She reports that Prednisone did seem to help her cough most of all. She noticed no symptomatic improvement in her cough with ProAir.   Review of Systems No joint swelling, pain, or stiffness.  No dry eyes or oral ulcers. Has chronic dry mouth. A pertinent 14 point review of systems is negative except as per the history of presenting illness.  No Known Allergies  Current Outpatient Prescriptions on File Prior to Visit  Medication Sig Dispense Refill  . escitalopram (LEXAPRO) 20 MG tablet Take 20 mg by mouth  daily.    Marland Kitchen lisinopril (PRINIVIL,ZESTRIL) 10 MG tablet Take 1 tablet (10 mg total) by mouth daily. 30 tablet 2  . Fluticasone-Salmeterol (ADVAIR) 100-50 MCG/DOSE AEPB Inhale 1 puff into the lungs 2 (two) times daily. (Patient not taking: Reported on 01/01/2015) 1 each 3  . HYDROcodone-homatropine (HYCODAN) 5-1.5 MG/5ML syrup Take 5 mLs by mouth every 6 (six) hours as needed for cough. (Patient not taking: Reported on 01/01/2015) 180 mL 0  . mirabegron ER (MYRBETRIQ) 25 MG TB24 tablet Take 1 tablet (25 mg total) by mouth daily. (Patient not taking: Reported on 01/01/2015) 30 tablet 0  . naproxen (NAPROSYN) 500 MG tablet Take 1 tablet (500 mg total) by mouth 2 (two) times daily with a meal. (Patient not taking: Reported on 01/01/2015) 30 tablet 0  . predniSONE (DELTASONE) 10 MG tablet 4tab by mouth x 3day,3tab x 3day,2tab x 3day,1tab x 3 day (Patient not taking: Reported on 01/01/2015) 30 tablet 0  . PROAIR HFA 108 (90 BASE) MCG/ACT inhaler INHALE 1-2 PUFFS Q 4-6 H PRN (Patient not taking: Reported on 01/01/2015) 1 Inhaler 5   No current facility-administered medications on file prior to visit.   Past Medical History  Diagnosis Date  . Hypertension   . Depression   . Psoriasis   . Chicken pox   . Shingles   . Migraine   . Urinary incontinence   . UTI (lower urinary tract infection)   . Hiatal hernia    Past Surgical History  Procedure Laterality Date  . Cesarean section    . Laparoscopy    . Laparoscopic gastric banding     Family History  Problem Relation Age of Onset  . Arthritis Mother   . Heart disease Mother   . Hypertension Mother   . Depression Mother   . Diabetes Mother   . COPD Mother   . Fibromyalgia Mother   . Arthritis Father   . Lung cancer Father   . Heart disease Father   . COPD Father   . Emphysema Father   . Diabetes Maternal Grandmother   . Heart disease Maternal Grandmother   . Diabetes Maternal Grandfather   . Heart disease Maternal Grandfather   . Heart  disease Paternal Grandmother   . Heart disease Paternal Grandfather   . Prostate cancer Paternal Grandfather   . Fibromyalgia Sister    Social History   Social History  . Marital Status: Single    Spouse Name: N/A  . Number of Children: 2  . Years of Education: 18+   Occupational History  . Teacher    Social History Main Topics  . Smoking status: Former Smoker -- 0.10 packs/day for 5 years    Types: Cigarettes    Start date: 05/13/1999    Quit date: 05/12/2004  . Smokeless tobacco: Never Used     Comment: Significant second-hand exposure through both parents  . Alcohol Use: 0.0 oz/week    0 Standard drinks or equivalent per week     Comment: occasional   . Drug Use: No  . Sexual Activity: Not Asked   Other Topics Concern  . None   Social History Narrative   Originally from [place]. Previously lived in [place] & [place]. No international travel. 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 Currently has 2 dogs. Remote exposure to a cockatiel for 2 years in a previous house. No mold or hot tub exposure. Reports other teachers have told her that there is mold in her current high school.       Objective:   Physical Exam Blood pressure 118/66, pulse 83, height  (1.575 m), weight 199 lb 9.6 oz (90.538 kg), SpO2 98 %. General:  Awake. Alert. No acute distress. Caucasian female with mild central obesity.  Integument:  Warm & dry. No rash on exposed skin. No bruising. Lymphatics:  No appreciated cervical or supraclavicular lymphadenoapthy. HEENT:  Moist mucus membranes. No oral ulcers. No scleral injection or icterus. Moderate bilateral nasal turbinate swelling with erythematous mucosa. PERRL. Cardiovascular:  Regular rate. No edema. No appreciable JVD.  Pulmonary:  Good aeration & clear to auscultation bilaterally. Symmetric chest wall expansion. No accessory muscle use. Abdomen: Soft. Normal bowel  sounds. Protuberant. Grossly nontender. Musculoskeletal:  Normal bulk and tone. Hand grip strength 5/5 bilaterally. No joint deformity or effusion appreciated. Neurological:  CN 2-12 grossly in tact. No meningismus. Moving all 4 extremities equally. Symmetric patellar deep tendon reflexes. Psychiatric:  Mood and affect congruent. Speech normal rhythm, rate & tone.   IMAGING CTA CHEST 09/20/14 (personally reviewed by me): No evidence for pulmonary embolus. Patchy bilateral groundglass infiltrates. No consolidation or nodule appreciated. No pleural effusion or thickening. No pathologic mediastinal adenopathy appreciated. No pericardial effusion. Left anterior third rib costochondral joint calcification.  LABS  09/20/14 CBC: 9.9/13.8/40.7/394 BMP: 136/2.0/104/24/<5/0.96/125/9.0 Troponin I: 0.00    Assessment & Plan:  The patient is a 49 year old Caucasian female with known history  of prior lung disease presenting with a persistent cough that seems to have improved somewhat symptomatically since its onset in May 2016. In reviewing her chest CT scan did tablet that she does have a small hiatal hernia and with the presence of a laparoscopic gastric band potential silent laryngo-esophageal reflux contributing to her cough and sputum production is high. She exhibits no other signs or symptoms that would be consistent with asthma or underlying lung disease but does have a significant prior history of secondhand tobacco exposure. An atypical infection such as nontuberculous mycobacteria would be possible but I feel is somewhat unlikely given her risk factors. I am starting her on empiric therapy for silent laryngo-esophageal reflux and advised her to fill the prescription for Advair as she did previously have symptomatic relief on prednisone. I instructed her on proper technique and oral hygiene with the Advair Diskus. She will contact my office for any further questions or concerns.  1. Cough: Suspect  combination of postinfectious cough with silent laryngo-esophageal reflux. Symptomatic treatment with Hydromet cough syrup and Tessalon Perles. Checking sputum culture for AFB, fungus, and bacteria. Also checking CT scan of the chest without contrast. 2. Dyspnea: Suspect secondary to underlying cough. Checking CT scan of the chest without contrast. Plan for pulmonary function testing in the future. 3. Hiatal hernia: Suspect underlying silent laryngo-esophageal reflux. Checking barium swallow. Patient counseled on appropriate dietary and lifestyle modifications to prevent reflux. Starting Zantac 150 mg by mouth daily at bedtime. 4. Follow-up: Patient to return to clinic in 2-4 weeks.

## 2015-01-01 NOTE — Patient Instructions (Signed)
1. We are checking a swallowing study to determine if you have reflux or if her LAP-BAND is contributing to your cough through the reflux. 2. We are starting Zantac 150 milligrams by mouth daily at bedtime to help with any silent reflux she may be having. Please avoid eating within 3 hours of bedtime. 3. We are checking a culture of her sputum as well as a CT scan of her chest to determine if there may be any underlying infection contributing to your cough. 4. I am sending in prescriptions for [facility] as well as Hydromet cough syrup to use as needed. 5. You'll return to clinic in 2-4 weeks but please contact my office if he had any further questions or concerns.

## 2015-01-02 ENCOUNTER — Encounter: Payer: Self-pay | Admitting: Family

## 2015-01-02 ENCOUNTER — Ambulatory Visit (INDEPENDENT_AMBULATORY_CARE_PROVIDER_SITE_OTHER): Payer: BC Managed Care – PPO | Admitting: Family

## 2015-01-02 VITALS — BP 132/88 | HR 74 | Temp 98.2°F | Resp 18 | Ht 62.0 in | Wt 201.0 lb

## 2015-01-02 DIAGNOSIS — A499 Bacterial infection, unspecified: Secondary | ICD-10-CM | POA: Diagnosis not present

## 2015-01-02 DIAGNOSIS — B9689 Other specified bacterial agents as the cause of diseases classified elsewhere: Secondary | ICD-10-CM | POA: Insufficient documentation

## 2015-01-02 DIAGNOSIS — N393 Stress incontinence (female) (male): Secondary | ICD-10-CM

## 2015-01-02 DIAGNOSIS — N760 Acute vaginitis: Secondary | ICD-10-CM

## 2015-01-02 MED ORDER — MIRABEGRON ER 25 MG PO TB24: 25.0000 mg | ORAL_TABLET | Freq: Every day | ORAL | Status: DC

## 2015-01-02 MED ORDER — METRONIDAZOLE 500 MG PO TABS: 500.0000 mg | ORAL_TABLET | Freq: Two times a day (BID) | ORAL | Status: DC

## 2015-01-02 NOTE — Assessment & Plan Note (Signed)
Symptoms and exam consistent with bacterial vaginosis. Patient refuses physical exam. Start metronidazole. Instructed to avoid alcohol while on metronidazole. Given information on when to seek additional care and pelvic inflammatory disease should it develop. Follow-up if symptoms worsen or fail to improve with metronidazole.

## 2015-01-02 NOTE — Progress Notes (Signed)
Subjective:    Patient ID: Caitlin Lane, female    DOB: 1966-04-09, 49 y.o.   MRN: [id number]  Chief Complaint  Patient presents with  . Vaginal issues    Does not think she has STDs, has vaginal odor and discharge, states that it has only happend when wearing a tampon, says that she just wore a tampon a couple weeks ago and it started after that, also used a douche    HPI:  Caitlin Lane is a 49 y.o. female with a PMH of essential hypertension, hiatal hernia, psoriasis, depression and obesity who presents today for an acute office visit.     1.) Vaginal discharge - This is a new problem. Associated symptom of odor and discharge from her vagina has been going on for about 10 days.Marland Kitchen Described as a fishy odor with white colored discharge. Modifying factors include douching which made the symptoms worse. Believes this may be related to tampon usage, as she does not regularly use a tampon. Denies fevers, chills, or abdominal pain.   2.) Stress incontinence - Previous seen for stress incontinence and maintained on Myrbetriq. She is not currently taking the medication as she ran out. Indicates it did help to control her symptoms without adverse side effects. Modifying factors include Kegel exercises which have not provided much relief.   No Known Allergies   Current Outpatient Prescriptions on File Prior to Visit  Medication Sig Dispense Refill  . benzonatate (TESSALON PERLES) 100 MG capsule 1-2 capsules every 8 hours as needed for cough 90 capsule 1  . escitalopram (LEXAPRO) 20 MG tablet Take 20 mg by mouth daily.    Marland Kitchen HYDROcodone-homatropine (HYDROMET) 5-1.5 MG/5ML syrup Take 5 mLs by mouth at bedtime as needed for cough. 180 mL 0  . lisinopril (PRINIVIL,ZESTRIL) 10 MG tablet Take 1 tablet (10 mg total) by mouth daily. 30 tablet 2  . ranitidine (ZANTAC) 150 MG tablet Take 1 tablet (150 mg total) by mouth at bedtime. 30 tablet 3   No current facility-administered medications on file prior to  visit.    Past Medical History  Diagnosis Date  . Hypertension   . Depression   . Psoriasis   . Chicken pox   . Shingles   . Migraine   . Urinary incontinence   . UTI (lower urinary tract infection)   . Hiatal hernia       Review of Systems  Constitutional: Negative for fever and chills.  Gastrointestinal: Negative for abdominal pain.  Genitourinary: Positive for vaginal discharge. Negative for dysuria, frequency, flank pain, vaginal bleeding and vaginal pain.      Objective:    BP 132/88 mmHg  Pulse 74  Temp(Src) 98.2 F (36.8 C) (Oral)  Resp 18  Ht  (1.575 m)  Wt 201 lb (91.173 kg)  BMI 36.75 kg/m2  SpO2 97% Nursing note and vital signs reviewed.  Physical Exam  Constitutional: She is oriented to person, place, and time. She appears well-developed and well-nourished. No distress.  Cardiovascular: Normal rate, regular rhythm, normal heart sounds and intact distal pulses.   Pulmonary/Chest: Effort normal and breath sounds normal.  Genitourinary:  Declines wet prep and physical exam.  Neurological: She is alert and oriented to person, place, and time.  Skin: Skin is warm and dry.  Psychiatric: She has a normal mood and affect. Her behavior is normal. Judgment and thought content normal.       Assessment & Plan:   Problem List Items Addressed This Visit  Genitourinary   BV (bacterial vaginosis) - Primary    Symptoms and exam consistent with bacterial vaginosis. Patient refuses physical exam. Start metronidazole. Instructed to avoid alcohol while on metronidazole. Given information on when to seek additional care and pelvic inflammatory disease should it develop. Follow-up if symptoms worsen or fail to improve with metronidazole.      Relevant Medications   metroNIDAZOLE (FLAGYL) 500 MG tablet     Other   Stress incontinence    Previously evaluated for stress incontinence and on Myrbetriq. Denies adverse effects of medication. Continue current dose  of Myrbetriq. Referral placed to urology per patient request.       Relevant Medications   mirabegron ER (MYRBETRIQ) 25 MG TB24 tablet   Other Relevant Orders   Ambulatory referral to Urology

## 2015-01-02 NOTE — Patient Instructions (Addendum)
Thank you for choosing [facility].  Summary/Instructions:  NO ALCOHOL while on the medication.   Your prescription(s) have been submitted to your pharmacy or been printed and provided for you. Please take as directed and contact our office if you believe you are having problem(s) with the medication(s) or have any questions.  Referrals have been made during this visit. You should expect to hear back from our schedulers in about 7-10 days in regards to establishing an appointment with the specialists we discussed.   If your symptoms worsen or fail to improve, please contact our office for further instruction, or in case of emergency go directly to the emergency room at the closest medical facility.   Pelvic Inflammatory Disease Pelvic inflammatory disease (PID) refers to an infection in some or all of the female organs. The infection can be in the uterus, ovaries, fallopian tubes, or the surrounding tissues in the pelvis. PID can cause abdominal or pelvic pain that comes on suddenly (acute pelvic pain). PID is a serious infection because it can lead to lasting (chronic) pelvic pain or the inability to have children (infertile).  CAUSES  The infection is often caused by the normal bacteria found in the vaginal tissues. PID may also be caused by an infection that is spread during sexual contact. PID can also occur following:   The birth of a baby.   A miscarriage.   An abortion.   Major pelvic surgery.   The use of an intrauterine device (IUD).   A sexual assault.  RISK FACTORS Certain factors can put a person at higher risk for PID, such as:  Being younger than 25 years.  Being sexually active at [place] age.  Usingnonbarrier contraception.  Havingmultiple sexual partners.  Having sex with someone who has symptoms of a genital infection.  Using oral contraception. Other times, certain behaviors can increase the possibility of getting PID, such as:  Having sex  during your period.  Using a vaginal douche.  Having an intrauterine device (IUD) in place. SYMPTOMS   Abdominal or pelvic pain.   Fever.   Chills.   Abnormal vaginal discharge.  Abnormal uterine bleeding.   Unusual pain shortly after finishing your period. DIAGNOSIS  Your caregiver will choose some of the following methods to make a diagnosis, such as:   Performinga physical exam and history. A pelvic exam typically reveals a very tender uterus and surrounding pelvis.   Ordering laboratory tests including a pregnancy test, blood tests, and urine test.  Orderingcultures of the vagina and cervix to check for a sexually transmitted infection (STI).  Performing an ultrasound.   Performing a laparoscopic procedure to look inside the pelvis.  TREATMENT   Antibiotic medicines may be prescribed and taken by mouth.   Sexual partners may be treated when the infection is caused by a sexually transmitted disease (STD).   Hospitalization may be needed to give antibiotics intravenously.  Surgery may be needed, but this is rare. It may take weeks until you are completely well. If you are diagnosed with PID, you should also be checked for human immunodeficiency virus (HIV). HOME CARE INSTRUCTIONS   If given, take your antibiotics as directed. Finish the medicine even if you start to feel better.   Only take over-the-counter or prescription medicines for pain, discomfort, or fever as directed by your caregiver.   Do not have sexual intercourse until treatment is completed or as directed by your caregiver. If PID is confirmed, your recent sexual partner(s) will need  treatment.   Keep your follow-up appointments. SEEK MEDICAL CARE IF:   You have increased or abnormal vaginal discharge.   You need prescription medicine for your pain.   You vomit.   You cannot take your medicines.   Your partner has an STD.  SEEK IMMEDIATE MEDICAL CARE IF:   You have a  fever.   You have increased abdominal or pelvic pain.   You have chills.   You have pain when you urinate.   You are not better after 72 hours following treatment.  MAKE SURE YOU:   Understand these instructions.  Will watch your condition.  Will get help right away if you are not doing well or get worse. Document Released: 04/28/2005 Document Revised: 08/23/2012 Document Reviewed: 04/24/2011 [facility] Patient Information 2015 [place], [place]. This information is not intended to replace advice given to you by your health care provider. Make sure you discuss any questions you have with your health care provider.

## 2015-01-02 NOTE — Assessment & Plan Note (Signed)
Previously evaluated for stress incontinence and on Myrbetriq. Denies adverse effects of medication. Continue current dose of Myrbetriq. Referral placed to urology per patient request.

## 2015-01-02 NOTE — Progress Notes (Signed)
Pre visit review using our clinic review tool, if applicable. No additional management support is needed unless otherwise documented below in the visit note. 

## 2015-01-03 ENCOUNTER — Inpatient Hospital Stay: Admission: RE | Admit: 2015-01-03 | Payer: BC Managed Care – PPO | Source: Ambulatory Visit

## 2015-01-04 ENCOUNTER — Ambulatory Visit (HOSPITAL_COMMUNITY): Payer: BC Managed Care – PPO

## 2015-01-10 ENCOUNTER — Encounter: Payer: Self-pay | Admitting: Family

## 2015-01-29 ENCOUNTER — Ambulatory Visit: Payer: BC Managed Care – PPO | Admitting: Pulmonary Disease

## 2015-02-19 ENCOUNTER — Ambulatory Visit: Payer: BC Managed Care – PPO | Admitting: Family

## 2015-02-23 ENCOUNTER — Ambulatory Visit: Payer: BC Managed Care – PPO | Admitting: Family

## 2015-02-27 ENCOUNTER — Ambulatory Visit: Payer: BC Managed Care – PPO | Admitting: Family

## 2015-02-27 DIAGNOSIS — Z0289 Encounter for other administrative examinations: Secondary | ICD-10-CM

## 2015-04-11 ENCOUNTER — Ambulatory Visit (INDEPENDENT_AMBULATORY_CARE_PROVIDER_SITE_OTHER)
Admission: RE | Admit: 2015-04-11 | Discharge: 2015-04-11 | Disposition: A | Discharge status: Home / Self Care | Payer: BC Managed Care – PPO | Source: Ambulatory Visit | Attending: Family | Admitting: Family

## 2015-04-11 ENCOUNTER — Encounter: Payer: Self-pay | Admitting: Family

## 2015-04-11 ENCOUNTER — Ambulatory Visit (INDEPENDENT_AMBULATORY_CARE_PROVIDER_SITE_OTHER): Payer: BC Managed Care – PPO | Admitting: Family

## 2015-04-11 VITALS — BP 134/84 | HR 72 | Temp 97.7°F | Resp 18 | Ht 62.0 in | Wt 196.4 lb

## 2015-04-11 DIAGNOSIS — I10 Essential (primary) hypertension: Secondary | ICD-10-CM

## 2015-04-11 DIAGNOSIS — M25552 Pain in left hip: Secondary | ICD-10-CM | POA: Diagnosis not present

## 2015-04-11 DIAGNOSIS — F32A Depression, unspecified: Secondary | ICD-10-CM

## 2015-04-11 DIAGNOSIS — R059 Cough, unspecified: Secondary | ICD-10-CM

## 2015-04-11 DIAGNOSIS — F329 Major depressive disorder, single episode, unspecified: Secondary | ICD-10-CM | POA: Diagnosis not present

## 2015-04-11 DIAGNOSIS — Z23 Encounter for immunization: Secondary | ICD-10-CM | POA: Diagnosis not present

## 2015-04-11 DIAGNOSIS — R05 Cough: Secondary | ICD-10-CM

## 2015-04-11 MED ORDER — ESCITALOPRAM OXALATE 20 MG PO TABS: 20.0000 mg | ORAL_TABLET | Freq: Every day | ORAL | Status: DC

## 2015-04-11 MED ORDER — AZILSARTAN MEDOXOMIL 40 MG PO TABS: 40.0000 mg | ORAL_TABLET | Freq: Every day | ORAL | Status: DC

## 2015-04-11 NOTE — Patient Instructions (Signed)
Thank you for choosing [facility].  Summary/Instructions:  Your prescription(s) have been submitted to your pharmacy or been printed and provided for you. Please take as directed and contact our office if you believe you are having problem(s) with the medication(s) or have any questions.  Please stop by radiology on the basement level of the building for your x-rays. Your results will be released to MyChart (or called to you) after review, usually within 72 hours after test completion. If any treatments or changes are necessary, you will be notified at that same time.  If your symptoms worsen or fail to improve, please contact our office for further instruction, or in case of emergency go directly to the emergency room at the closest medical facility.   Generic Hip Exercises RANGE OF MOTION (ROM) AND STRETCHING EXERCISES  These exercises may help you when beginning to rehabilitate your injury. Doing them too aggressively can worsen your condition. Complete them slowly and gently. Your symptoms may resolve with or without further involvement from your physician, physical therapist or athletic trainer. While completing these exercises, remember:   Restoring tissue flexibility helps normal motion to return to the joints. This allows healthier, less painful movement and activity.  An effective stretch should be held for at least 30 seconds.  A stretch should never be painful. You should only feel a gentle lengthening or release in the stretched tissue. If these stretches worsen your symptoms even when done gently, consult your physician, physical therapist or athletic trainer. STRETCH - Hamstrings, Supine   Lie on your back. Loop a belt or towel over the ball of your right / left foot.  Straighten your right / left knee and slowly pull on the belt to raise your leg. Do not allow the right / left knee to bend. Keep your opposite leg flat on the floor.  Raise the leg until you feel a  gentle stretch behind your right / left knee or thigh. Hold this position for __________ seconds. Repeat __________ times. Complete this stretch __________ times per day.  STRETCH - Hip Rotators   Lie on your back on a firm surface. Grasp your right / left knee with your right / left hand and your ankle with your opposite hand.  Keeping your hips and shoulders firmly planted, gently pull your right / left knee and rotate your lower leg toward your opposite shoulder until you feel a stretch in your buttocks.  Hold this stretch for __________ seconds. Repeat this stretch __________ times. Complete this stretch __________ times per day. STRETCH - Hamstrings/Adductors, V-Sit   Sit on the floor with your legs extended in a large "V," keeping your knees straight.  With your head and chest upright, bend at your waist reaching for your right foot to stretch your left adductors.  You should feel a stretch in your left inner thigh. Hold for __________ seconds.  Return to the upright position to relax your leg muscles.  Continuing to keep your chest upright, bend straight forward at your waist to stretch your hamstrings.  You should feel a stretch behind both of your thighs and/or knees. Hold for __________ seconds.  Return to the upright position to relax your leg muscles.  Repeat steps 2 through 4 for opposite leg. Repeat __________ times. Complete this exercise __________ times per day.  STRETCHING - Hip Flexors, Lunge  Half kneel with your right / left knee on the floor and your opposite knee bent and directly over your ankle.  Keep  good posture with your head over your shoulders. Tighten your buttocks to point your tailbone downward; this will prevent your back from arching too much.  You should feel a gentle stretch in the front of your thigh and/or hip. If you do not feel any resistance, slightly slide your opposite foot forward and then slowly lunge forward so your knee once again  lines up over your ankle. Be sure your tailbone remains pointed downward.  Hold this stretch for __________ seconds. Repeat __________ times. Complete this stretch __________ times per day. STRENGTHENING EXERCISES These exercises may help you when beginning to rehabilitate your injury. They may resolve your symptoms with or without further involvement from your physician, physical therapist or athletic trainer. While completing these exercises, remember:   Muscles can gain both the endurance and the strength needed for everyday activities through controlled exercises.  Complete these exercises as instructed by your physician, physical therapist or athletic trainer. Progress the resistance and repetitions only as guided.  You may experience muscle soreness or fatigue, but the pain or discomfort you are trying to eliminate should never worsen during these exercises. If this pain does worsen, stop and make certain you are following the directions exactly. If the pain is still present after adjustments, discontinue the exercise until you can discuss the trouble with your clinician. STRENGTH - Hip Extensors, Bridge   Lie on your back on a firm surface. Bend your knees and place your feet flat on the floor.  Tighten your buttocks muscles and lift your bottom off the floor until your trunk is level with your thighs. You should feel the muscles in your buttocks and back of your thighs working. If you do not feel these muscles, slide your feet 1-2 inches further away from your buttocks.  Hold this position for __________ seconds.  Slowly lower your hips to the starting position and allow your buttock muscles relax completely before beginning the next repetition.  If this exercise is too easy, you may cross your arms over your chest. Repeat __________ times. Complete this exercise __________ times per day.  STRENGTH - Hip Abductors, Straight Leg Raises  Be aware of your form throughout the entire  exercise so that you exercise the correct muscles. Sloppy form means that you are not strengthening the correct muscles.  Lie on your side so that your head, shoulders, knee and hip line up. You may bend your lower knee to help maintain your balance. Your right / left leg should be on top.  Roll your hips slightly forward, so that your hips are stacked directly over each other and your right / left knee is facing forward.  Lift your top leg up 4-6 inches, leading with your heel. Be sure that your foot does not drift forward or that your knee does not roll toward the ceiling.  Hold this position for __________ seconds. You should feel the muscles in your outer hip lifting (you may not notice this until your leg begins to tire).  Slowly lower your leg to the starting position. Allow the muscles to fully relax before beginning the next repetition. Repeat __________ times. Complete this exercise __________ times per day.  STRENGTH - Hip Adductors, Straight Leg Raises   Lie on your side so that your head, shoulders, knee and hip line up. You may place your upper foot in front to help maintain your balance. Your right / left leg should be on the bottom.  Roll your hips slightly forward, so that your  hips are stacked directly over each other and your right / left knee is facing forward.  Tense the muscles in your inner thigh and lift your bottom leg 4-6 inches. Hold this position for __________ seconds.  Slowly lower your leg to the starting position. Allow the muscles to fully relax before beginning the next repetition. Repeat __________ times. Complete this exercise __________ times per day.  STRENGTH - Quadriceps, Straight Leg Raises  Quality counts! Watch for signs that the quadriceps muscle is working to insure you are strengthening the correct muscles and not "cheating" by substituting with healthier muscles.  Lay on your back with your right / left leg extended and your opposite knee  bent.  Tense the muscles in the front of your right / left thigh. You should see either your knee cap slide up or increased dimpling just above the knee. Your thigh may even quiver.  Tighten these muscles even more and raise your leg 4 to 6 inches off the floor. Hold for right / left seconds.  Keeping these muscles tense, lower your leg.  Relax the muscles slowly and completely in between each repetition. Repeat __________ times. Complete this exercise __________ times per day.  STRENGTH - Hip Abductors, Standing  Tie one end of a rubber exercise band/tubing to a secure surface (table, pole) and tie a loop at the other end.  Place the loop around your right / left ankle. Keeping your ankle with the band directly opposite of the secured end, step away until there is tension in the tube/band.  Hold onto a chair as needed for balance.  Keeping your back upright, your shoulders over your hips, and your toes pointing forward, lift your right / left leg out to your side. Be sure to lift your leg with your hip muscles. Do not "throw" your leg or tip your body to lift your leg.  Slowly and with control, return to the starting position. Repeat exercise __________ times. Complete this exercise __________ times per day.  STRENGTH - Quadriceps, Squats  Stand in a door frame so that your feet and knees are in line with the frame.  Use your hands for balance, not support, on the frame.  Slowly lower your weight, bending at the hips and knees. Keep your lower legs upright so that they are parallel with the door frame. Squat only within the range that does not increase your knee pain. Never let your hips drop below your knees.  Slowly return upright, pushing with your legs, not pulling with your hands.   This information is not intended to replace advice given to you by your health care provider. Make sure you discuss any questions you have with your health care provider.   Document Released:  05/16/2005 Document Revised: 05/19/2014 Document Reviewed: 08/10/2008 Elsevier Interactive Patient Education [employer]

## 2015-04-11 NOTE — Assessment & Plan Note (Signed)
Depression has been labile secondary to running out of medication with no refills. Does feel better with the medication. Restart Lexapro. Denies suicidal ideations. Follow up in 3 months or sooner if needed.

## 2015-04-11 NOTE — Assessment & Plan Note (Signed)
Left hip pain of undetermined origin with pain "deep in hip." Obtain x-rays to rule out structural or bone pathology. Does have moderate amount of muscle tightness. Treat conservatively with heat and home exercise therapy. OTC medications as needed for symptom control. Follow up if symptoms worsen or do not improve pending x-ray results.

## 2015-04-11 NOTE — Progress Notes (Signed)
Subjective:    Patient ID: Caitlin Lane, female    DOB: [birth year], 49 y.o.   MRN: [id number]  Chief Complaint  Patient presents with  . Follow-up    medication refill of lexapro, hip pain in left hip    HPI:  Caitlin Lane is a 49 y.o. female who  has a past medical history of Hypertension; Depression; Psoriasis; Chicken pox; Shingles; Migraine; Urinary incontinence; UTI (lower urinary tract infection); and Hiatal hernia. and presents today for a follow up office visit.   1.) Depression - Currently maintained on escitalopram. Takes the medication as prescribed and denies adverse side effects. Reports that her depression is adequately controlled with this regimen. Denies any suicidal ideation.    2.) Hip pain - This is a new problem. Associated symptom of pain located in left hip has been going on for about a couple of months located in her groin. Reports that when she was a teenager it would "pop" out of joint on occasion. Not able to sleep on the left side secondary to pain. The pain does wax and wane. Describes the pain as deep ache. Modifying factors include ibpuprofen which does help with the pain. Severity of the pain is about 3/10. Minimal effect on activities of daily living. Denies trauma or sounds/sensations heard or felt.   3.) Cough - Associated symptom of a cough has been going on for several months. Denies symptoms of infectious processes. Currently taking lisinopril. No history of asthma noted. Questions if cough is related to lisinopril.    No Known Allergies   Current Outpatient Prescriptions on File Prior to Visit  Medication Sig Dispense Refill  . HYDROcodone-homatropine (HYDROMET) 5-1.5 MG/5ML syrup Take 5 mLs by mouth at bedtime as needed for cough. 180 mL 0  . metroNIDAZOLE (FLAGYL) 500 MG tablet Take 1 tablet (500 mg total) by mouth 2 (two) times daily. 14 tablet 0  . mirabegron ER (MYRBETRIQ) 25 MG TB24 tablet Take 1 tablet (25 mg total) by mouth daily. 30 tablet 3    . ranitidine (ZANTAC) 150 MG tablet Take 1 tablet (150 mg total) by mouth at bedtime. 30 tablet 3   No current facility-administered medications on file prior to visit.    Past Medical History  Diagnosis Date  . Hypertension   . Depression   . Psoriasis   . Chicken pox   . Shingles   . Migraine   . Urinary incontinence   . UTI (lower urinary tract infection)   . Hiatal hernia     Review of Systems  Constitutional: Negative for fever, chills and unexpected weight change.  Eyes:       Negative for changes in vision  Respiratory: Positive for cough. Negative for chest tightness and shortness of breath.   Cardiovascular: Negative for chest pain, palpitations and leg swelling.  Musculoskeletal:       Positive for left hip pain  Neurological: Negative for headaches.  Psychiatric/Behavioral: Negative for suicidal ideas, sleep disturbance and dysphoric mood.      Objective:    BP 134/84 mmHg  Pulse 72  Temp(Src) 97.7 F (36.5 C) (Oral)  Resp 18  Ht  (1.575 m)  Wt 196 lb 6.4 oz (89.086 kg)  BMI 35.91 kg/m2  SpO2 98% Nursing note and vital signs reviewed.  Physical Exam  Constitutional: She is oriented to person, place, and time. She appears well-developed and well-nourished. No distress.  Cardiovascular: Normal rate, regular rhythm, normal heart sounds and intact distal pulses.  Pulmonary/Chest: Effort normal and breath sounds normal. No respiratory distress. She has no wheezes. She has no rales. She exhibits no tenderness.  Musculoskeletal:  Left hip - no obvious deformity, discoloration, or edema noted. Tenderness elicited over anterior and lateral thigh that is described as sore. Hip range of motion is slightly decreased in all directions. Faber's test results in hip discomfort. Muscle strength is 4+ in all directions. Hip scouring is negative. Distal pulses and sensation are intact and appropriate.  Neurological: She is alert and oriented to person, place, and  time.  Skin: Skin is warm and dry.  Psychiatric: She has a normal mood and affect. Her behavior is normal. Judgment and thought content normal.       Assessment & Plan:   Problem List Items Addressed This Visit      Other   Depression    Depression has been labile secondary to running out of medication with no refills. Does feel better with the medication. Restart Lexapro. Denies suicidal ideations. Follow up in 3 months or sooner if needed.       Relevant Medications   escitalopram (LEXAPRO) 20 MG tablet   Left hip pain - Primary    Left hip pain of undetermined origin with pain "deep in hip." Obtain x-rays to rule out structural or bone pathology. Does have moderate amount of muscle tightness. Treat conservatively with heat and home exercise therapy. OTC medications as needed for symptom control. Follow up if symptoms worsen or do not improve pending x-ray results.       Relevant Orders   DG HIP UNILAT WITH PELVIS 2-3 VIEWS LEFT    Other Visit Diagnoses    Encounter for immunization

## 2015-04-11 NOTE — Assessment & Plan Note (Signed)
Cough of undetermined origin however cannot rule out medication or GERD. Discontinue lisinopril. Start azilsartan. Does not appear to be infectious in nature or related to asthma. Follow-up pending trial of discontinuation of lisinopril if symptoms worsen or fail to improve.

## 2015-04-11 NOTE — Progress Notes (Signed)
Pre visit review using our clinic review tool, if applicable. No additional management support is needed unless otherwise documented below in the visit note. 

## 2015-04-12 ENCOUNTER — Telehealth: Payer: Self-pay | Admitting: Family

## 2015-04-12 NOTE — Telephone Encounter (Signed)
LVM with results

## 2015-04-12 NOTE — Telephone Encounter (Signed)
Please inform patient there are no abnormalities and to continue with the treatment plan discussed.

## 2015-05-21 ENCOUNTER — Other Ambulatory Visit: Payer: Self-pay | Admitting: Family

## 2015-07-11 ENCOUNTER — Other Ambulatory Visit: Payer: Self-pay | Admitting: Family

## 2015-07-18 ENCOUNTER — Telehealth: Payer: Self-pay | Admitting: Family

## 2015-07-18 NOTE — Telephone Encounter (Signed)
Patient called in regarding her appt. She is having a vaginal odor and intermittent bleeding. She does not have an obgyn currently and would like a referral. She is also scared of it being infection, so she does have an acute on Friday.

## 2015-07-19 NOTE — Telephone Encounter (Signed)
Noted  

## 2015-07-20 ENCOUNTER — Encounter: Payer: Self-pay | Admitting: Family

## 2015-07-20 ENCOUNTER — Ambulatory Visit (INDEPENDENT_AMBULATORY_CARE_PROVIDER_SITE_OTHER): Payer: BC Managed Care – PPO | Admitting: Family

## 2015-07-20 VITALS — BP 120/80 | HR 79 | Temp 97.9°F | Ht 62.0 in | Wt 206.0 lb

## 2015-07-20 DIAGNOSIS — N9489 Other specified conditions associated with female genital organs and menstrual cycle: Secondary | ICD-10-CM

## 2015-07-20 DIAGNOSIS — N898 Other specified noninflammatory disorders of vagina: Secondary | ICD-10-CM

## 2015-07-20 MED ORDER — METRONIDAZOLE 500 MG PO TABS: 500.0000 mg | ORAL_TABLET | Freq: Two times a day (BID) | ORAL | Status: DC

## 2015-07-20 NOTE — Progress Notes (Signed)
   Subjective:    Patient ID: Caitlin Lane, female    DOB: 09/08/1965, 50 y.o.   MRN: [id number]  Chief Complaint  Patient presents with  . Vaginal Bleeding    Pt had a heavy period 2 weeks ago lasting about 5-6 days. Odor was noticed right after the menstrual cycle. pt describes the odor as a rotten type of smell.     HPI:  Caitlin PennerMary Lane is a 50 y.o. female who  has a past medical history of Hypertension; Depression; Psoriasis; Chicken pox; Shingles; Migraine; Urinary incontinence; UTI (lower urinary tract infection); and Hiatal hernia. and presents today for an acute office visit.   1.) Vaginal odor - This is a new problem. Associated symptom of a vaginal odor has been going on for about 2 weeks following a heavy menstrual cycle. Had not had a period in several months. Describes the smell as rotten. Describes the discharge as slightly pink. The discharge is daily.    No Known Allergies   Current Outpatient Prescriptions on File Prior to Visit  Medication Sig Dispense Refill  . EDARBI 40 MG TABS TAKE 1 TABLET ONCE DAILY. 30 tablet 3  . escitalopram (LEXAPRO) 20 MG tablet Take 1 tablet (20 mg total) by mouth daily. 90 tablet 0   No current facility-administered medications on file prior to visit.     Past Surgical History  Procedure Laterality Date  . Cesarean section    . Laparoscopy    . Laparoscopic gastric banding       Review of Systems  Constitutional: Negative for fever and chills.  Gastrointestinal: Negative for abdominal pain.  Genitourinary: Positive for vaginal bleeding and menstrual problem. Negative for dysuria, flank pain, vaginal discharge, vaginal pain and dyspareunia.      Objective:    BP 120/80 mmHg  Pulse 79  Temp(Src) 97.9 F (36.6 C) (Oral)  Ht 5\' 2"  (1.575 m)  Wt 206 lb (93.441 kg)  BMI 37.67 kg/m2  SpO2 95%  LMP 07/06/2015 Nursing note and vital signs reviewed.  Physical Exam  Constitutional: She is oriented to person, place, and time. She  appears well-developed and well-nourished. No distress.  Cardiovascular: Normal rate, regular rhythm, normal heart sounds and intact distal pulses.   Pulmonary/Chest: Effort normal and breath sounds normal.  Neurological: She is alert and oriented to person, place, and time.  Skin: Skin is warm and dry.  Psychiatric: She has a normal mood and affect. Her behavior is normal. Judgment and thought content normal.       Assessment & Plan:   Problem List Items Addressed This Visit      Other   Vaginal odor - Primary    Symptoms consistent with irregular menstruation and question incomplete menstruation. Start metronidazole to cover for bacterial vaginosis. Refer to gynecology for further assessment and well woman exam. Follow up if symptoms worsen or fail to improve prior to then.       Relevant Medications   metroNIDAZOLE (FLAGYL) 500 MG tablet   Other Relevant Orders   Ambulatory referral to Gynecology

## 2015-07-20 NOTE — Assessment & Plan Note (Signed)
Symptoms consistent with irregular menstruation and question incomplete menstruation. Start metronidazole to cover for bacterial vaginosis. Refer to gynecology for further assessment and well woman exam. Follow up if symptoms worsen or fail to improve prior to then.

## 2015-07-20 NOTE — Progress Notes (Signed)
Pre visit review using our clinic review tool, if applicable. No additional management support is needed unless otherwise documented below in the visit note. 

## 2015-07-20 NOTE — Patient Instructions (Addendum)
Thank you for choosing [facility].  Summary/Instructions:  Your prescription(s) have been submitted to your pharmacy or been printed and provided for you. Please take as directed and contact our office if you believe you are having problem(s) with the medication(s) or have any questions.  Referrals have been made during this visit. You should expect to hear back from our schedulers in about 7-10 days in regards to establishing an appointment with the specialists we discussed.   If your symptoms worsen or fail to improve, please contact our office for further instruction, or in case of emergency go directly to the emergency room at the closest medical facility.   Bacterial Vaginosis Bacterial vaginosis is a vaginal infection that occurs when the normal balance of bacteria in the vagina is disrupted. It results from an overgrowth of certain bacteria. This is the most common vaginal infection in women of childbearing age. Treatment is important to prevent complications, especially in pregnant women, as it can cause a premature delivery. CAUSES  Bacterial vaginosis is caused by an increase in harmful bacteria that are normally present in smaller amounts in the vagina. Several different kinds of bacteria can cause bacterial vaginosis. However, the reason that the condition develops is not fully understood. RISK FACTORS Certain activities or behaviors can put you at an increased risk of developing bacterial vaginosis, including:  Having a new sex partner or multiple sex partners.  Douching.  Using an intrauterine device (IUD) for contraception. Women do not get bacterial vaginosis from toilet seats, bedding, swimming pools, or contact with objects around them. SIGNS AND SYMPTOMS  Some women with bacterial vaginosis have no signs or symptoms. Common symptoms include:  Grey vaginal discharge.  A fishlike odor with discharge, especially after sexual intercourse.  Itching or burning of  the vagina and vulva.  Burning or pain with urination. DIAGNOSIS  Your health care provider will take a medical history and examine the vagina for signs of bacterial vaginosis. A sample of vaginal fluid may be taken. Your health care provider will look at this sample under a microscope to check for bacteria and abnormal cells. A vaginal pH test may also be done.  TREATMENT  Bacterial vaginosis may be treated with antibiotic medicines. These may be given in the form of a pill or a vaginal cream. A second round of antibiotics may be prescribed if the condition comes back after treatment. Because bacterial vaginosis increases your risk for sexually transmitted diseases, getting treated can help reduce your risk for chlamydia, gonorrhea, HIV, and herpes. HOME CARE INSTRUCTIONS   Only take over-the-counter or prescription medicines as directed by your health care provider.  If antibiotic medicine was prescribed, take it as directed. Make sure you finish it even if you start to feel better.  Tell all sexual partners that you have a vaginal infection. They should see their health care provider and be treated if they have problems, such as a mild rash or itching.  During treatment, it is important that you follow these instructions:  Avoid sexual activity or use condoms correctly.  Do not douche.  Avoid alcohol as directed by your health care provider.  Avoid breastfeeding as directed by your health care provider. SEEK MEDICAL CARE IF:   Your symptoms are not improving after 3 days of treatment.  You have increased discharge or pain.  You have a fever. MAKE SURE YOU:   Understand these instructions.  Will watch your condition.  Will get help right away if you are not  doing well or get worse. FOR MORE INFORMATION  Centers for Disease Control and Prevention, Division of STD Prevention: [employer] American Sexual Health Association (ASHA): www.ashastd.org    This information is  not intended to replace advice given to you by your health care provider. Make sure you discuss any questions you have with your health care provider.   Document Released: 04/28/2005 Document Revised: 05/19/2014 Document Reviewed: 12/08/2012 Elsevier Interactive Patient Education [employer]

## 2015-07-26 ENCOUNTER — Ambulatory Visit: Payer: BC Managed Care – PPO | Admitting: Family

## 2015-07-31 ENCOUNTER — Ambulatory Visit (INDEPENDENT_AMBULATORY_CARE_PROVIDER_SITE_OTHER): Payer: BC Managed Care – PPO | Admitting: Women's Health

## 2015-07-31 ENCOUNTER — Other Ambulatory Visit: Payer: Self-pay

## 2015-07-31 ENCOUNTER — Encounter: Payer: Self-pay | Admitting: Women's Health

## 2015-07-31 ENCOUNTER — Encounter: Payer: Self-pay | Admitting: Gastroenterology

## 2015-07-31 VITALS — BP 136/84 | Ht 62.0 in | Wt 204.0 lb

## 2015-07-31 DIAGNOSIS — Z1231 Encounter for screening mammogram for malignant neoplasm of breast: Secondary | ICD-10-CM

## 2015-07-31 DIAGNOSIS — Z01419 Encounter for gynecological examination (general) (routine) without abnormal findings: Secondary | ICD-10-CM | POA: Diagnosis not present

## 2015-07-31 DIAGNOSIS — N898 Other specified noninflammatory disorders of vagina: Secondary | ICD-10-CM | POA: Diagnosis not present

## 2015-07-31 LAB — WET PREP FOR TRICH, YEAST, CLUE
CLUE CELLS WET PREP: NONE SEEN
TRICH WET PREP: NONE SEEN
YEAST WET PREP: NONE SEEN

## 2015-07-31 MED ORDER — FLUCONAZOLE 150 MG PO TABS: 150.0000 mg | ORAL_TABLET | Freq: Once | ORAL | Status: DC

## 2015-07-31 NOTE — Addendum Note (Signed)
Addended by: Berna SpareASTILLO, Marnell Mcdaniel A on: 07/31/2015 02:55 PM   Modules accepted: Orders

## 2015-07-31 NOTE — Patient Instructions (Addendum)
Colonoscopy Dr Carlean Purl or Dr Hilarie Fredrickson  [phone] [facility]  [place] Maintenance, Female Adopting a healthy lifestyle and getting preventive care can go a long way to promote health and wellness. Talk with your health care provider about what schedule of regular examinations is right for you. This is a good chance for you to check in with your provider about disease prevention and staying healthy. In between checkups, there are plenty of things you can do on your own. Experts have done a lot of research about which lifestyle changes and preventive measures are most likely to keep you healthy. Ask your health care provider for more information. WEIGHT AND DIET  Eat a healthy diet  Be sure to include plenty of vegetables, fruits, low-fat dairy products, and lean protein.  Do not eat a lot of foods high in solid fats, added sugars, or salt.  Get regular exercise. This is one of the most important things you can do for your health.  Most adults should exercise for at least 150 minutes each week. The exercise should increase your heart rate and make you sweat (moderate-intensity exercise).  Most adults should also do strengthening exercises at least twice a week. This is in addition to the moderate-intensity exercise.  Maintain a healthy weight  Body mass index (BMI) is a measurement that can be used to identify possible weight problems. It estimates body fat based on height and weight. Your health care provider can help determine your BMI and help you achieve or maintain a healthy weight.  For females 6 years of age and older:   A BMI below 18.5 is considered underweight.  A BMI of 18.5 to 24.9 is normal.  A BMI of 25 to 29.9 is considered overweight.  A BMI of 30 and above is considered obese.  Watch levels of cholesterol and blood lipids  You should start having your blood tested for lipids and cholesterol at 51 years of age, then have this test every 5 years.  You may  need to have your cholesterol levels checked more often if:  Your lipid or cholesterol levels are high.  You are older than 51 years of age.  You are at high risk for heart disease.  CANCER SCREENING   Lung Cancer  Lung cancer screening is recommended for adults 63-74 years old who are at high risk for lung cancer because of a history of smoking.  A yearly low-dose CT scan of the lungs is recommended for people who:  Currently smoke.  Have quit within the past 15 years.  Have at least a 30-pack-year history of smoking. A pack year is smoking an average of one pack of cigarettes a day for 1 year.  Yearly screening should continue until it has been 15 years since you quit.  Yearly screening should stop if you develop a health problem that would prevent you from having lung cancer treatment.  Breast Cancer  Practice breast self-awareness. This means understanding how your breasts normally appear and feel.  It also means doing regular breast self-exams. Let your health care provider know about any changes, no matter how small.  If you are in your 20s or 30s, you should have a clinical breast exam (CBE) by a health care provider every 1-3 years as part of a regular health exam.  If you are 22 or older, have a CBE every year. Also consider having a breast X-ray (mammogram) every year.  If you have a family history of breast  cancer, talk to your health care provider about genetic screening.  If you are at high risk for breast cancer, talk to your health care provider about having an MRI and a mammogram every year.  Breast cancer gene (BRCA) assessment is recommended for women who have family members with BRCA-related cancers. BRCA-related cancers include:  Breast.  Ovarian.  Tubal.  Peritoneal cancers.  Results of the assessment will determine the need for genetic counseling and BRCA1 and BRCA2 testing. Cervical Cancer Your health care provider may recommend that you be  screened regularly for cancer of the pelvic organs (ovaries, uterus, and vagina). This screening involves a pelvic examination, including checking for microscopic changes to the surface of your cervix (Pap test). You may be encouraged to have this screening done every 3 years, beginning at age 65.  For women ages 49-65, health care providers may recommend pelvic exams and Pap testing every 3 years, or they may recommend the Pap and pelvic exam, combined with testing for human papilloma virus (HPV), every 5 years. Some types of HPV increase your risk of cervical cancer. Testing for HPV may also be done on women of any age with unclear Pap test results.  Other health care providers may not recommend any screening for nonpregnant women who are considered low risk for pelvic cancer and who do not have symptoms. Ask your health care provider if a screening pelvic exam is right for you.  If you have had past treatment for cervical cancer or a condition that could lead to cancer, you need Pap tests and screening for cancer for at least 20 years after your treatment. If Pap tests have been discontinued, your risk factors (such as having a new sexual partner) need to be reassessed to determine if screening should resume. Some women have medical problems that increase the chance of getting cervical cancer. In these cases, your health care provider may recommend more frequent screening and Pap tests. Colorectal Cancer  This type of cancer can be detected and often prevented.  Routine colorectal cancer screening usually begins at 51 years of age and continues through 51 years of age.  Your health care provider may recommend screening at an earlier age if you have risk factors for colon cancer.  Your health care provider may also recommend using home test kits to check for hidden blood in the stool.  A small camera at the end of a tube can be used to examine your colon directly (sigmoidoscopy or colonoscopy).  This is done to check for the earliest forms of colorectal cancer.  Routine screening usually begins at age 92.  Direct examination of the colon should be repeated every 5-10 years through 51 years of age. However, you may need to be screened more often if early forms of precancerous polyps or small growths are found. Skin Cancer  Check your skin from head to toe regularly.  Tell your health care provider about any new moles or changes in moles, especially if there is a change in a mole's shape or color.  Also tell your health care provider if you have a mole that is larger than the size of a pencil eraser.  Always use sunscreen. Apply sunscreen liberally and repeatedly throughout the day.  Protect yourself by wearing long sleeves, pants, a wide-brimmed hat, and sunglasses whenever you are outside. HEART DISEASE, DIABETES, AND HIGH BLOOD PRESSURE   High blood pressure causes heart disease and increases the risk of stroke. High blood pressure is more likely  to develop in:  People who have blood pressure in the high end of the normal range (130-139/85-89 mm Hg).  People who are overweight or obese.  People who are African American.  If you are 74-6 years of age, have your blood pressure checked every 3-5 years. If you are 79 years of age or older, have your blood pressure checked every year. You should have your blood pressure measured twice--once when you are at a hospital or clinic, and once when you are not at a hospital or clinic. Record the average of the two measurements. To check your blood pressure when you are not at a hospital or clinic, you can use:  An automated blood pressure machine at a pharmacy.  A home blood pressure monitor.  If you are between 88 years and 4 years old, ask your health care provider if you should take aspirin to prevent strokes.  Have regular diabetes screenings. This involves taking a blood sample to check your fasting blood sugar level.  If you  are at a normal weight and have a low risk for diabetes, have this test once every three years after 51 years of age.  If you are overweight and have a high risk for diabetes, consider being tested at a younger age or more often. PREVENTING INFECTION  Hepatitis B  If you have a higher risk for hepatitis B, you should be screened for this virus. You are considered at high risk for hepatitis B if:  You were born in a country where hepatitis B is common. Ask your health care provider which countries are considered high risk.  Your parents were born in a high-risk country, and you have not been immunized against hepatitis B (hepatitis B vaccine).  You have HIV or AIDS.  You use needles to inject street drugs.  You live with someone who has hepatitis B.  You have had sex with someone who has hepatitis B.  You get hemodialysis treatment.  You take certain medicines for conditions, including cancer, organ transplantation, and autoimmune conditions. Hepatitis C  Blood testing is recommended for:  Everyone born from [birth year] through 1966.  Anyone with known risk factors for hepatitis C. Sexually transmitted infections (STIs)  You should be screened for sexually transmitted infections (STIs) including gonorrhea and chlamydia if:  You are sexually active and are younger than 51 years of age.  You are older than 51 years of age and your health care provider tells you that you are at risk for this type of infection.  Your sexual activity has changed since you were last screened and you are at an increased risk for chlamydia or gonorrhea. Ask your health care provider if you are at risk.  If you do not have HIV, but are at risk, it may be recommended that you take a prescription medicine daily to prevent HIV infection. This is called pre-exposure prophylaxis (PrEP). You are considered at risk if:  You are sexually active and do not regularly use condoms or know the HIV status of your  partner(s).  You take drugs by injection.  You are sexually active with a partner who has HIV. Talk with your health care provider about whether you are at high risk of being infected with HIV. If you choose to begin PrEP, you should first be tested for HIV. You should then be tested every 3 months for as long as you are taking PrEP.  PREGNANCY   If you are premenopausal and you may become  pregnant, ask your health care provider about preconception counseling.  If you may become pregnant, take 400 to 800 micrograms (mcg) of folic acid every day.  If you want to prevent pregnancy, talk to your health care provider about birth control (contraception). OSTEOPOROSIS AND MENOPAUSE   Osteoporosis is a disease in which the bones lose minerals and strength with aging. This can result in serious bone fractures. Your risk for osteoporosis can be identified using a bone density scan.  If you are 27 years of age or older, or if you are at risk for osteoporosis and fractures, ask your health care provider if you should be screened.  Ask your health care provider whether you should take a calcium or vitamin D supplement to lower your risk for osteoporosis.  Menopause may have certain physical symptoms and risks.  Hormone replacement therapy may reduce some of these symptoms and risks. Talk to your health care provider about whether hormone replacement therapy is right for you.  HOME CARE INSTRUCTIONS   Schedule regular health, dental, and eye exams.  Stay current with your immunizations.   Do not use any tobacco products including cigarettes, chewing tobacco, or electronic cigarettes.  If you are pregnant, do not drink alcohol.  If you are breastfeeding, limit how much and how often you drink alcohol.  Limit alcohol intake to no more than 1 drink per day for nonpregnant women. One drink equals 12 ounces of beer, 5 ounces of wine, or 1 ounces of hard liquor.  Do not use street drugs.  Do  not share needles.  Ask your health care provider for help if you need support or information about quitting drugs.  Tell your health care provider if you often feel depressed.  Tell your health care provider if you have ever been abused or do not feel safe at home.   This information is not intended to replace advice given to you by your health care provider. Make sure you discuss any questions you have with your health care provider.   Document Released: 11/11/2010 Document Revised: 05/19/2014 Document Reviewed: 03/30/2013 Elsevier Interactive Patient Education [employer].

## 2015-07-31 NOTE — Progress Notes (Signed)
Caitlin PennerMary Lane 11/19/1965 [id number]    History:    Presents for annual exam.  Perimenopausal, given a monthly cycle until December, skipped January and February cycle in March. BTL/same partner years. Normal Pap and mammogram history, overdue . Treated for BV in March with Flagyl good symptom relief. Hypertension, depression primary care manages.   Past medical history, past surgical history, family history and social history were all reviewed and documented in the EPIC chart. 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 History of gastric banding has gained most of the weight back. Parents fibromyalgia, mother diabetes, COPD and hypertension. Father lung cancer.  ROS:  A ROS was performed and pertinent positives and negatives are included.  Exam:  Filed Vitals:   07/31/15 1414  BP: 136/84    General appearance:  Normal Thyroid:  Symmetrical, normal in size, without palpable masses or nodularity. Respiratory  Auscultation:  Clear without wheezing or rhonchi Cardiovascular  Auscultation:  Regular rate, without rubs, murmurs or gallops  Edema/varicosities:  Not grossly evident Abdominal  Soft,nontender, without masses, guarding or rebound.  Liver/spleen:  No organomegaly noted  Hernia:  None appreciated  Skin  Inspection:  Grossly normal   Breasts: Examined lying and sitting.     Right: Without masses, retractions, discharge or axillary adenopathy.     Left: Without masses, retractions, discharge or axillary adenopathy. Gentitourinary   Inguinal/mons:  Normal without inguinal adenopathy  External genitalia:  Normal  BUS/Urethra/Skene's glands:  Normal  Vagina:  NormalWet prep negative  Cervix:  Normal  Uterus:   normal in size, shape and contour.  Midline and mobile  Adnexa/parametria:     Rt: Without masses or tenderness.   Lt: Without masses or tenderness.  Anus and perineum: Normal  Digital rectal exam: Normal sphincter tone without palpated masses or tenderness  Assessment/Plan:  50 y.o. S  WF G2 P2  for annual exam.     Menopausal/BTL/same partner Obesity History of gastric banding Hypertension/depression-primary care manages labs and meds  Plan: Screening colonoscopy reviewed uterine encouraged, [facility] information given instructed to schedule. SBE's, annual screening mammogram, breast center information given and reviewed records of annual screen. Decrease calories and increase exercise for weight loss encouraged, vitamin D 1000 daily encouraged. UA, Pap with HR HPV typing, new screening guidelines reviewed. Reviewed wet prep negative.   Harrington ChallengerYOUNG,Alyra Patty J WHNP, 2:42 PM 07/31/2015

## 2015-08-01 LAB — URINALYSIS W MICROSCOPIC + REFLEX CULTURE
Bacteria, UA: NONE SEEN [HPF]
Bilirubin Urine: NEGATIVE
Casts: NONE SEEN [LPF]
Crystals: NONE SEEN [HPF]
Glucose, UA: NEGATIVE
Hgb urine dipstick: NEGATIVE
KETONES UR: NEGATIVE
Leukocytes, UA: NEGATIVE
Nitrite: NEGATIVE
Protein, ur: NEGATIVE
SPECIFIC GRAVITY, URINE: 1.015 (ref 1.001–1.035)
Yeast: NONE SEEN [HPF]
pH: 6 (ref 5.0–8.0)

## 2015-08-02 LAB — PAP, TP IMAGING W/ HPV RNA, RFLX HPV TYPE 16,18/45: HPV mRNA, High Risk: NOT DETECTED

## 2015-08-03 LAB — URINE CULTURE

## 2015-08-15 ENCOUNTER — Ambulatory Visit
Admission: RE | Admit: 2015-08-15 | Discharge: 2015-08-15 | Disposition: A | Discharge status: Home / Self Care | Payer: BC Managed Care – PPO | Source: Ambulatory Visit

## 2015-08-15 DIAGNOSIS — Z1231 Encounter for screening mammogram for malignant neoplasm of breast: Secondary | ICD-10-CM

## 2015-08-16 ENCOUNTER — Encounter: Payer: Self-pay | Admitting: Women's Health

## 2015-08-29 ENCOUNTER — Other Ambulatory Visit: Payer: Self-pay | Admitting: Family

## 2015-09-04 ENCOUNTER — Ambulatory Visit: Payer: BC Managed Care – PPO | Admitting: Family

## 2015-09-11 ENCOUNTER — Encounter: Payer: Self-pay | Admitting: Family

## 2015-09-11 ENCOUNTER — Ambulatory Visit (INDEPENDENT_AMBULATORY_CARE_PROVIDER_SITE_OTHER): Payer: BC Managed Care – PPO | Admitting: Family

## 2015-09-11 VITALS — BP 112/82 | HR 89 | Temp 97.8°F | Resp 16 | Ht 62.0 in | Wt 208.0 lb

## 2015-09-11 DIAGNOSIS — F329 Major depressive disorder, single episode, unspecified: Secondary | ICD-10-CM

## 2015-09-11 DIAGNOSIS — F909 Attention-deficit hyperactivity disorder, unspecified type: Secondary | ICD-10-CM

## 2015-09-11 DIAGNOSIS — F988 Other specified behavioral and emotional disorders with onset usually occurring in childhood and adolescence: Secondary | ICD-10-CM | POA: Insufficient documentation

## 2015-09-11 DIAGNOSIS — R600 Localized edema: Secondary | ICD-10-CM | POA: Insufficient documentation

## 2015-09-11 DIAGNOSIS — F32A Depression, unspecified: Secondary | ICD-10-CM

## 2015-09-11 MED ORDER — AMPHETAMINE-DEXTROAMPHETAMINE 5 MG PO TABS: 5.0000 mg | ORAL_TABLET | Freq: Every day | ORAL | Status: DC

## 2015-09-11 MED ORDER — CHLORTHALIDONE 25 MG PO TABS: 12.5000 mg | ORAL_TABLET | Freq: Every day | ORAL | Status: AC | PRN

## 2015-09-11 MED ORDER — ESCITALOPRAM OXALATE 20 MG PO TABS: 10.0000 mg | ORAL_TABLET | Freq: Every day | ORAL | Status: DC

## 2015-09-11 NOTE — Patient Instructions (Signed)
Thank you for choosing [facility].  Summary/Instructions:  Please start the chlothaladone as needed for edema.  Decrease Lexapro to 10 mg daily.  Start Adderall as needed for ADD.   Your prescription(s) have been submitted to your pharmacy or been printed and provided for you. Please take as directed and contact our office if you believe you are having problem(s) with the medication(s) or have any questions.  If your symptoms worsen or fail to improve, please contact our office for further instruction, or in case of emergency go directly to the emergency room at the closest medical facility.

## 2015-09-11 NOTE — Progress Notes (Signed)
Subjective:    Patient ID: Caitlin Lane, female    DOB: [birth year], 50 y.o.   MRN: [id number]  Chief Complaint  Patient presents with  . Leg Swelling    feels like she has alot of fluid in her legs, and wants to talk about getting on ADD medication, was diagnosed in the passed and was on adderall    HPI:  Caitlin Lane is a 50 y.o. female who  has a past medical history of Depression; Psoriasis; Chicken pox; Shingles; Migraine; and Hiatal hernia. and presents today for an office visit.   1.) Lower extremity edema - Associated symptom of swelling located in her bilateral lower legs that has been going on for several weeks. Modifying factors include elevating her legs which has helped. She has previously been diagnosed with sleep apnea and has not used the prescribed CPAP machine. Notes that she continues to take it off at night.   2.) ADD - This is a new problem. Previously diagnosed with ADD about 9 years ago by her primary care provider. Treated with Ritalin which she did not care for secondary to headaches towards the end of the day. She has noted an exacerbation of decreased attention over the past several months. When in school she is having trouble concentrating and completing lesson plans.   No Known Allergies   Current Outpatient Prescriptions on File Prior to Visit  Medication Sig Dispense Refill  . calcipotriene-betamethasone (TACLONEX) ointment Apply 1 application topically 2 (two) times daily. Reported on 07/31/2015    . EDARBI 40 MG TABS TAKE 1 TABLET ONCE DAILY. 30 tablet 3  . escitalopram (LEXAPRO) 20 MG tablet TAKE 1 TABLET ONCE DAILY. 90 tablet 0  . Multiple Vitamin (MULTIVITAMIN) tablet Take 1 tablet by mouth daily.    Marland Kitchen UNABLE TO FIND PROBIOTIC - PLEXUS     No current facility-administered medications on file prior to visit.    Past Medical History  Diagnosis Date  . Depression   . Psoriasis   . Chicken pox   . Shingles   . Migraine   . Hiatal hernia      Past  Surgical History  Procedure Laterality Date  . Laparoscopy    . Laparoscopic gastric banding    . Cesarean section      X2      Review of Systems  Constitutional: Negative for fever and chills.  Respiratory: Negative for chest tightness and shortness of breath.   Cardiovascular: Positive for leg swelling. Negative for chest pain and palpitations.  Neurological: Negative for weakness and headaches.      Objective:    BP 112/82 mmHg  Pulse 89  Temp(Src) 97.8 F (36.6 C) (Oral)  Resp 16  Ht  (1.575 m)  Wt 208 lb (94.348 kg)  BMI 38.03 kg/m2  SpO2 98% Nursing note and vital signs reviewed.  Physical Exam  Constitutional: She is oriented to person, place, and time. She appears well-developed and well-nourished. No distress.  Cardiovascular: Normal rate, regular rhythm, normal heart sounds and intact distal pulses.   Mild 1+ pitting edema noted in bilateral lower extremities with pulses being 1+.   Pulmonary/Chest: Effort normal and breath sounds normal.  Neurological: She is alert and oriented to person, place, and time.  Skin: Skin is warm and dry.  Psychiatric: She has a normal mood and affect. Her behavior is normal. Judgment and thought content normal.       Assessment & Plan:   Problem List Items Addressed  This Visit      Other   Depression    Interested in trying a different medication. Decrease Lexpro to 10 mg. Consider starting Wellbutrin to assist with weight loss and depression. No suicidal ideations noted.       Lower extremity edema - Primary    Lower extremity edema most likely related to sleep apnea. Start chlorthalidone. Encouraged to decrease sodium, elevate legs when seated, and compression socks as needed. If symptoms worsen or do not improve consider additional lab work or imaging.       Relevant Medications   chlorthalidone (HYGROTON) 25 MG tablet   ADD (attention deficit disorder)    Previously diagnosed with ADD by her former primary care  provider with unsuccessful treatment on Ritalin. Increased difficulty with concentration noted recently. Medication risks and side effects discussed.  Start Addreall. Follow up in 1 month. [place] Controlled Substance Database reviewed with no irregularities.       Relevant Medications   amphetamine-dextroamphetamine (ADDERALL) 5 MG tablet       I have discontinued Ms. Amacher's metroNIDAZOLE and fluconazole. I am also having her start on amphetamine-dextroamphetamine and chlorthalidone. Additionally, I am having her maintain her EDARBI, calcipotriene-betamethasone, multivitamin, UNABLE TO FIND, and escitalopram.   Meds ordered this encounter  Medications  . amphetamine-dextroamphetamine (ADDERALL) 5 MG tablet    Sig: Take 1 tablet (5 mg total) by mouth daily with breakfast.    Dispense:  30 tablet    Refill:  0    Order Specific Question:  Supervising Provider    Answer:  Hillard DankerRAWFORD, ELIZABETH A [[id number]]  . chlorthalidone (HYGROTON) 25 MG tablet    Sig: Take 0.5-1 tablets (12.5-25 mg total) by mouth daily as needed. For edema    Dispense:  30 tablet    Refill:  0    Order Specific Question:  Supervising Provider    Answer:  Hillard DankerRAWFORD, ELIZABETH A [[id number]]     Follow-up: Return in about 1 month (around 10/12/2015).  Jeanine Luzalone, Gregory, FNP

## 2015-09-11 NOTE — Assessment & Plan Note (Signed)
Interested in trying a different medication. Decrease Lexpro to 10 mg. Consider starting Wellbutrin to assist with weight loss and depression. No suicidal ideations noted.

## 2015-09-11 NOTE — Assessment & Plan Note (Signed)
Lower extremity edema most likely related to sleep apnea. Start chlorthalidone. Encouraged to decrease sodium, elevate legs when seated, and compression socks as needed. If symptoms worsen or do not improve consider additional lab work or imaging.

## 2015-09-11 NOTE — Progress Notes (Signed)
Pre visit review using our clinic review tool, if applicable. No additional management support is needed unless otherwise documented below in the visit note. 

## 2015-09-11 NOTE — Assessment & Plan Note (Addendum)
Previously diagnosed with ADD by her former primary care provider with unsuccessful treatment on Ritalin. Increased difficulty with concentration noted recently. Medication risks and side effects discussed.  Start Addreall. Follow up in 1 month. [place] Controlled Substance Database reviewed with no irregularities.

## 2015-09-13 ENCOUNTER — Telehealth: Payer: Self-pay | Admitting: Family

## 2015-09-13 NOTE — Telephone Encounter (Signed)
Pt called and wanted to know the status of the PA for the adderall, Have you seen the PA.  She said that she is needing it really badly.

## 2015-09-14 NOTE — Telephone Encounter (Signed)
PA initiated via CoverMyMeds key [id number]

## 2015-09-14 NOTE — Telephone Encounter (Signed)
PA APPROVED, pt advised in detail via personal VM

## 2015-09-17 NOTE — Telephone Encounter (Signed)
Approval dates: 09/14/2015 - 09/14/2018

## 2015-10-01 ENCOUNTER — Encounter: Payer: BC Managed Care – PPO | Admitting: Gastroenterology

## 2015-10-03 ENCOUNTER — Ambulatory Visit (INDEPENDENT_AMBULATORY_CARE_PROVIDER_SITE_OTHER): Payer: BC Managed Care – PPO | Admitting: Internal Medicine

## 2015-10-03 ENCOUNTER — Other Ambulatory Visit: Payer: BC Managed Care – PPO

## 2015-10-03 ENCOUNTER — Encounter: Payer: Self-pay | Admitting: Internal Medicine

## 2015-10-03 VITALS — BP 132/78 | HR 85 | Temp 98.0°F | Resp 20 | Wt 205.0 lb

## 2015-10-03 DIAGNOSIS — F32A Depression, unspecified: Secondary | ICD-10-CM

## 2015-10-03 DIAGNOSIS — R300 Dysuria: Secondary | ICD-10-CM | POA: Insufficient documentation

## 2015-10-03 DIAGNOSIS — I10 Essential (primary) hypertension: Secondary | ICD-10-CM | POA: Diagnosis not present

## 2015-10-03 DIAGNOSIS — F329 Major depressive disorder, single episode, unspecified: Secondary | ICD-10-CM | POA: Diagnosis not present

## 2015-10-03 DIAGNOSIS — N393 Stress incontinence (female) (male): Secondary | ICD-10-CM

## 2015-10-03 LAB — POCT URINALYSIS DIPSTICK
BILIRUBIN UA: NEGATIVE
GLUCOSE UA: NEGATIVE
Ketones, UA: NEGATIVE
Nitrite, UA: NEGATIVE
SPEC GRAV UA: 1.02
Urobilinogen, UA: NEGATIVE
pH, UA: 6

## 2015-10-03 MED ORDER — CEPHALEXIN 500 MG PO CAPS: 500.0000 mg | ORAL_CAPSULE | Freq: Four times a day (QID) | ORAL | Status: AC

## 2015-10-03 NOTE — Progress Notes (Signed)
Pre visit review using our clinic review tool, if applicable. No additional management support is needed unless otherwise documented below in the visit note. 

## 2015-10-03 NOTE — Patient Instructions (Signed)
Please take all new medication as prescribed - the antibiotic  The specimen will be sent for culture, with results in 2-3 days usually  Please continue all other medications as before, and refills have been done if requested.  Please have the pharmacy call with any other refills you may need.  Please keep your appointments with your specialists as you may have planned

## 2015-10-03 NOTE — Progress Notes (Signed)
Subjective:    Patient ID: Caitlin Lane, female    DOB: June 28, 1965, 50 y.o.   MRN: [id number]  HPI  Here to f/u with 2 days onset dysuria, frequency, urgency, but no flank pain, hematuria or n/v, fever, chills. Pt denies chest pain, increased sob or doe, wheezing, orthopnea, PND, increased LE swelling, palpitations, dizziness or syncope.  Pt denies new neurological symptoms such as new headache, or facial or extremity weakness or numbness.  Pt denies polydipsia, polyuria, and only occas stress incontinence symptoms, Denies worsening depressive symptoms, suicidal ideation, or panic  Past Medical History  Diagnosis Date  . Depression   . Psoriasis   . Chicken pox   . Shingles   . Migraine   . Hiatal hernia    Past Surgical History  Procedure Laterality Date  . Laparoscopy    . Laparoscopic gastric banding    . Cesarean section      X2    reports that she quit smoking about 11 years ago. Her smoking use included Cigarettes. She started smoking about 16 years ago. She has a .5 pack-year smoking history. She has never used smokeless tobacco. She reports that she drinks alcohol. She reports that she does not use illicit drugs. family history includes Arthritis in her father and mother; Breast cancer in her paternal aunt; COPD in her father and mother; Depression in her mother; Diabetes in her maternal grandfather, maternal grandmother, and mother; Emphysema in her father; Fibromyalgia in her mother and sister; Heart disease in her father, maternal grandfather, maternal grandmother, mother, paternal grandfather, and paternal grandmother; Hypertension in her mother; Lung cancer in her father; Ovarian cancer in her maternal grandmother; Prostate cancer in her paternal grandfather. No Known Allergies Current Outpatient Prescriptions on File Prior to Visit  Medication Sig Dispense Refill  . amphetamine-dextroamphetamine (ADDERALL) 5 MG tablet Take 1 tablet (5 mg total) by mouth daily with breakfast.  30 tablet 0  . calcipotriene-betamethasone (TACLONEX) ointment Apply 1 application topically 2 (two) times daily. Reported on 07/31/2015    . chlorthalidone (HYGROTON) 25 MG tablet Take 0.5-1 tablets (12.5-25 mg total) by mouth daily as needed. For edema 30 tablet 0  . EDARBI 40 MG TABS TAKE 1 TABLET ONCE DAILY. 30 tablet 3  . escitalopram (LEXAPRO) 20 MG tablet Take 0.5 tablets (10 mg total) by mouth daily. 90 tablet 0  . Multiple Vitamin (MULTIVITAMIN) tablet Take 1 tablet by mouth daily.    Marland Kitchen UNABLE TO FIND PROBIOTIC - PLEXUS     No current facility-administered medications on file prior to visit.   Review of Systems All otherwise neg per pt    Objective:   Physical Exam BP 132/78 mmHg  Pulse 85  Temp(Src) 98 F (36.7 C) (Oral)  Resp 20  Wt 205 lb (92.987 kg)  SpO2 95% VS noted,  Constitutional: Pt appears in no apparent distress HENT: Head: NCAT.  Right Ear: External ear normal.  Left Ear: External ear normal.  Eyes: . Pupils are equal, round, and reactive to light. Conjunctivae and EOM are normal Neck: Normal range of motion. Neck supple.  Cardiovascular: Normal rate and regular rhythm.   Pulmonary/Chest: Effort normal and breath sounds without rales or wheezing.  Abd:  Soft,  ND, + BS, with low mid abd tender, no guarding or rebound, no flank tender Neurological: Pt is alert. Not confused , motor grossly intact Skin: Skin is warm. No rash, no LE edema Psychiatric: Pt behavior is normal. No agitation. not depressed affect  POCT Urinalysis Dipstick  Status: Finalresult Visible to patient:  Not Released Dx:  Dysuria              Ref Range 4:42 PM  71mo ago  3058yr ago     Color, UA  yellow  yellow    Clarity, UA  cloudy  cloudy    Glucose, UA  negative NEGATIVE negative    Bilirubin, UA  negative  negative    Ketones, UA  negative  negative    Spec Grav, UA  1.020  1.025    Blood, UA  2+  negative    pH, UA  6.0  6.0    Protein, UA  1+   negative    Urobilinogen, UA  negative  negative    Nitrite, UA  negative  negative    Leukocytes, UA Negative  large (3+) (A)               Assessment & Plan:

## 2015-10-06 LAB — CULTURE, URINE COMPREHENSIVE: Colony Count: >=100000

## 2015-10-07 NOTE — Assessment & Plan Note (Signed)
stable overall by history and exam, and pt to continue medical treatment as before,  to f/u any worsening symptoms or concerns 

## 2015-10-07 NOTE — Assessment & Plan Note (Signed)
Mild symptoms, consider urology f/u, .follwoj

## 2015-10-07 NOTE — Assessment & Plan Note (Signed)
Mild to mod, UA dip reviewed with pt, likely uti for cx but also for antibx course,  to f/u any worsening symptoms or concerns

## 2015-10-07 NOTE — Assessment & Plan Note (Signed)
stable overall by history and exam, recent data reviewed with pt, and pt to continue medical treatment as before,  to f/u any worsening symptoms or concerns BP Readings from Last 3 Encounters:  10/03/15 132/78  09/11/15 112/82  07/31/15 136/84

## 2015-10-09 ENCOUNTER — Encounter: Payer: BC Managed Care – PPO | Admitting: Gastroenterology

## 2015-10-11 ENCOUNTER — Ambulatory Visit (INDEPENDENT_AMBULATORY_CARE_PROVIDER_SITE_OTHER): Payer: BC Managed Care – PPO | Admitting: Family

## 2015-10-11 ENCOUNTER — Encounter: Payer: Self-pay | Admitting: Family

## 2015-10-11 VITALS — BP 134/86 | HR 64 | Temp 97.9°F | Resp 16 | Ht 62.0 in | Wt 207.0 lb

## 2015-10-11 DIAGNOSIS — F988 Other specified behavioral and emotional disorders with onset usually occurring in childhood and adolescence: Secondary | ICD-10-CM

## 2015-10-11 DIAGNOSIS — B373 Candidiasis of vulva and vagina: Secondary | ICD-10-CM | POA: Diagnosis not present

## 2015-10-11 DIAGNOSIS — F32A Depression, unspecified: Secondary | ICD-10-CM

## 2015-10-11 DIAGNOSIS — B3731 Acute candidiasis of vulva and vagina: Secondary | ICD-10-CM | POA: Insufficient documentation

## 2015-10-11 DIAGNOSIS — J302 Other seasonal allergic rhinitis: Secondary | ICD-10-CM | POA: Diagnosis not present

## 2015-10-11 DIAGNOSIS — F909 Attention-deficit hyperactivity disorder, unspecified type: Secondary | ICD-10-CM

## 2015-10-11 DIAGNOSIS — F329 Major depressive disorder, single episode, unspecified: Secondary | ICD-10-CM | POA: Diagnosis not present

## 2015-10-11 MED ORDER — AMPHETAMINE-DEXTROAMPHETAMINE 10 MG PO TABS: ORAL_TABLET | ORAL | Status: DC

## 2015-10-11 MED ORDER — MONTELUKAST SODIUM 10 MG PO TABS
10.0000 mg | ORAL_TABLET | Freq: Every day | ORAL | Status: AC
Start: 2015-10-11 — End: ?

## 2015-10-11 MED ORDER — AMPHETAMINE-DEXTROAMPHETAMINE 10 MG PO TABS: ORAL_TABLET | ORAL | Status: AC

## 2015-10-11 MED ORDER — FLUCONAZOLE 150 MG PO TABS: 150.0000 mg | ORAL_TABLET | Freq: Once | ORAL | Status: DC

## 2015-10-11 MED ORDER — BUPROPION HCL ER (SR) 150 MG PO TB12: ORAL_TABLET | ORAL | Status: DC

## 2015-10-11 NOTE — Progress Notes (Signed)
Pre visit review using our clinic review tool, if applicable. No additional management support is needed unless otherwise documented below in the visit note. 

## 2015-10-11 NOTE — Assessment & Plan Note (Signed)
Attention deficit improved with current regimen with mild adjustments made by patient. No adverse side effects. She is sleeping and eating well with no cardiac symptoms. [place] controlled substance database reviewed with no irregularities. Change Adderall to 10 mg in the morning and 5 mg in afternoon as needed. Follow-up in 3 months or sooner.

## 2015-10-11 NOTE — Progress Notes (Signed)
Subjective:    Patient ID: Caitlin Lane, female    DOB: 07/22/1965, 50 y.o.   MRN: [id number]  Chief Complaint  Patient presents with  . Follow-up    has a yeast infection, talk about adderall    HPI:  Caitlin Lane is a 50 y.o. female who  has a past medical history of Depression; Psoriasis; Chicken pox; Shingles; Migraine; and Hiatal hernia. and presents today for a follow up office visit.   1.) ADD - Currently maintained on Adderall. Reports taking 10 mg in the morning and 5 mg in the afternoon and denies adverse side effects. Averaging approximate 6-7 hours of sleep per night. Denies any significant appetite changes or weight loss. Denies chest pain, shortness of breath, or heart palpitations.  Wt Readings from Last 3 Encounters:  10/11/15 207 lb (93.895 kg)  10/03/15 205 lb (92.987 kg)  09/11/15 208 lb (94.348 kg)    2.) Yeast infection - This is a recurrent problem. Associated symptom itching and burning. Reports completing a recent antibiotics for a UTI. Denies fevers.   3.) Depression - previously started weaning off of Lexapro with potential change to Wellbutrin. Indicates no significant side effects with decrease dose of Lexapro. Denies suicidal ideations or exacerbations of depression. Wishing to continue with starting Wellbutrin.  4.) Seasonal allergies - continues to experience associated symptom of itchy watery eyes, sneezing, and congestion. Modifying factors include Flonase which has not helped significantly. Describes a thick sensation in the back of her throat. Denies fevers. Currently on antibiotics for urinary tract infection.   No Known Allergies   Current Outpatient Prescriptions on File Prior to Visit  Medication Sig Dispense Refill  . calcipotriene-betamethasone (TACLONEX) ointment Apply 1 application topically 2 (two) times daily. Reported on 07/31/2015    . cephALEXin (KEFLEX) 500 MG capsule Take 1 capsule (500 mg total) by mouth 4 (four) times daily. 40  capsule 0  . chlorthalidone (HYGROTON) 25 MG tablet Take 0.5-1 tablets (12.5-25 mg total) by mouth daily as needed. For edema 30 tablet 0  . EDARBI 40 MG TABS TAKE 1 TABLET ONCE DAILY. 30 tablet 3  . Multiple Vitamin (MULTIVITAMIN) tablet Take 1 tablet by mouth daily.    Marland Kitchen. UNABLE TO FIND PROBIOTIC - PLEXUS     No current facility-administered medications on file prior to visit.     Past Surgical History  Procedure Laterality Date  . Laparoscopy    . Laparoscopic gastric banding    . Cesarean section      X2    Past Medical History  Diagnosis Date  . Depression   . Psoriasis   . Chicken pox   . Shingles   . Migraine   . Hiatal hernia     Review of Systems  Constitutional: Negative for fever and chills.  Eyes:       Negative for changes in vision  Respiratory: Negative for cough, chest tightness and wheezing.   Cardiovascular: Negative for chest pain, palpitations and leg swelling.  Neurological: Negative for dizziness, weakness and light-headedness.      Objective:    BP 134/86 mmHg  Pulse 64  Temp(Src) 97.9 F (36.6 C) (Oral)  Resp 16  Ht 5\' 2"  (1.575 m)  Wt 207 lb (93.895 kg)  BMI 37.85 kg/m2  SpO2 97% Nursing note and vital signs reviewed.  Physical Exam  Constitutional: She is oriented to person, place, and time. She appears well-developed and well-nourished. No distress.  HENT:  Right Ear: Hearing, tympanic membrane,  external ear and ear canal normal.  Left Ear: Hearing, tympanic membrane, external ear and ear canal normal.  Nose: Nose normal.  Mouth/Throat: Uvula is midline, oropharynx is clear and moist and mucous membranes are normal.  Cardiovascular: Normal rate, regular rhythm, normal heart sounds and intact distal pulses.   Pulmonary/Chest: Effort normal and breath sounds normal.  Neurological: She is alert and oriented to person, place, and time.  Skin: Skin is warm and dry.  Psychiatric: She has a normal mood and affect. Her behavior is normal.  Judgment and thought content normal. She does not exhibit a depressed mood.       Assessment & Plan:   Problem List Items Addressed This Visit      Respiratory   Seasonal allergies    Seasonal allergies remain labile with over-the-counter medications. Continue current dosage of second-generation antihistamine and inhaled nasal corticosteroid. Start Singulair. Follow-up if symptoms do not improve or worsen.      Relevant Medications   montelukast (SINGULAIR) 10 MG tablet     Genitourinary   Vaginal candidiasis    Symptoms consistent with vaginal candidiasis secondary to antibiotic use. Start fluconazole. Follow-up if symptoms worsen or do not improve.      Relevant Medications   fluconazole (DIFLUCAN) 150 MG tablet     Other   Depression    Successfully weaned off of Lexapro. Discontinue Lexapro. Start Wellbutrin. No symptoms of suicidal ideations or exacerbations of depression. Follow-up in one month or sooner as needed.      Relevant Medications   buPROPion (WELLBUTRIN SR) 150 MG 12 hr tablet   ADD (attention deficit disorder) - Primary    Attention deficit improved with current regimen with mild adjustments made by patient. No adverse side effects. She is sleeping and eating well with no cardiac symptoms. [place] controlled substance database reviewed with no irregularities. Change Adderall to 10 mg in the morning and 5 mg in afternoon as needed. Follow-up in 3 months or sooner.      Relevant Medications   amphetamine-dextroamphetamine (ADDERALL) 10 MG tablet       I have discontinued Ms. Cafarelli's escitalopram. I am also having her start on fluconazole, buPROPion, and montelukast. Additionally, I am having her maintain her EDARBI, calcipotriene-betamethasone, multivitamin, UNABLE TO FIND, chlorthalidone, cephALEXin, and amphetamine-dextroamphetamine.   Meds ordered this encounter  Medications  . fluconazole (DIFLUCAN) 150 MG tablet    Sig: Take 1 tablet (150 mg  total) by mouth once. May repeat in 48 hours as needed.    Dispense:  2 tablet    Refill:  0    Order Specific Question:  Supervising Provider    Answer:  Hillard Danker A [[id number]]  . buPROPion (WELLBUTRIN SR) 150 MG 12 hr tablet    Sig: Take 1 tablet by mouth daily for 3 days and increase to 1 tablet by mouth twice daily.    Dispense:  60 tablet    Refill:  0    Order Specific Question:  Supervising Provider    Answer:  Hillard Danker A [[id number]]  . DISCONTD: amphetamine-dextroamphetamine (ADDERALL) 10 MG tablet    Sig: Take 1 tablet by mouth in the morning and 0.5 tablet by mouth in the afternoon as needed.    Dispense:  45 tablet    Refill:  0    Order Specific Question:  Supervising Provider    Answer:  Hillard Danker A [[id number]]  . montelukast (SINGULAIR) 10 MG tablet    Sig: Take 1 tablet (  10 mg total) by mouth at bedtime.    Dispense:  30 tablet    Refill:  0    Order Specific Question:  Supervising Provider    Answer:  Hillard Danker A [[id number]]  . amphetamine-dextroamphetamine (ADDERALL) 10 MG tablet    Sig: Take 1 tablet by mouth in the morning and 0.5 tablet by mouth in the afternoon as needed.    Dispense:  45 tablet    Refill:  0    Order Specific Question:  Supervising Provider    Answer:  Hillard Danker A [[id number]]     Follow-up: Return in about 1 month (around 11/10/2015), or if symptoms worsen or fail to improve.  Jeanine Luz, FNP

## 2015-10-11 NOTE — Assessment & Plan Note (Signed)
Symptoms consistent with vaginal candidiasis secondary to antibiotic use. Start fluconazole. Follow-up if symptoms worsen or do not improve.

## 2015-10-11 NOTE — Assessment & Plan Note (Signed)
Seasonal allergies remain labile with over-the-counter medications. Continue current dosage of second-generation antihistamine and inhaled nasal corticosteroid. Start Singulair. Follow-up if symptoms do not improve or worsen.

## 2015-10-11 NOTE — Assessment & Plan Note (Signed)
Successfully weaned off of Lexapro. Discontinue Lexapro. Start Wellbutrin. No symptoms of suicidal ideations or exacerbations of depression. Follow-up in one month or sooner as needed.

## 2015-10-11 NOTE — Patient Instructions (Signed)
Thank you for choosing [facility].  Summary/Instructions:  Please continue to take your medication.  Discontinue the Lexapro.  Start taking the Wellbutrin.   Start the Montelukast for allergies.  Your prescription(s) have been submitted to your pharmacy or been printed and provided for you. Please take as directed and contact our office if you believe you are having problem(s) with the medication(s) or have any questions.  If your symptoms worsen or fail to improve, please contact our office for further instruction, or in case of emergency go directly to the emergency room at the closest medical facility.

## 2015-10-22 ENCOUNTER — Other Ambulatory Visit: Payer: Self-pay | Admitting: Family

## 2015-10-22 ENCOUNTER — Telehealth: Payer: Self-pay | Admitting: *Deleted

## 2015-10-22 DIAGNOSIS — B373 Candidiasis of vulva and vagina: Secondary | ICD-10-CM

## 2015-10-22 DIAGNOSIS — B3731 Acute candidiasis of vulva and vagina: Secondary | ICD-10-CM

## 2015-10-22 MED ORDER — FLUCONAZOLE 150 MG PO TABS: 150.0000 mg | ORAL_TABLET | Freq: Once | ORAL | Status: AC

## 2015-10-22 NOTE — Telephone Encounter (Signed)
Left msg on triage stating she is still having sxs of yeast infection wanting to know can she have another refill on diflucan...Raechel Chute/lmb

## 2015-10-22 NOTE — Telephone Encounter (Signed)
Medication has been sent to pharmacy.  °

## 2015-10-23 NOTE — Telephone Encounter (Signed)
[facility]...Raechel Chute/lmb

## 2015-11-27 ENCOUNTER — Telehealth: Payer: Self-pay | Admitting: Emergency Medicine

## 2015-11-27 MED ORDER — AZILSARTAN MEDOXOMIL 40 MG PO TABS: 1.0000 | ORAL_TABLET | Freq: Every day | ORAL | Status: DC

## 2015-11-27 MED ORDER — LOSARTAN POTASSIUM 50 MG PO TABS: 50.0000 mg | ORAL_TABLET | Freq: Every day | ORAL | Status: AC

## 2015-11-27 NOTE — Telephone Encounter (Signed)
Forwarding to [place] for PA...Raechel Chute/lmb

## 2015-11-27 NOTE — Addendum Note (Signed)
Addended by: Jeanine LuzALONE, GREGORY D on: 11/27/2015 05:19 PM   Modules accepted: Orders, Medications

## 2015-11-27 NOTE — Telephone Encounter (Signed)
Pt called and her insurance needs prior approval to fill her prescription EDARBI 40 MG TABS. [place] to have the pharmacy there refill it. [facility] [address] [place]. Please follow up thanks.

## 2015-11-27 NOTE — Telephone Encounter (Signed)
Losartan sent to pharmacy.

## 2015-11-27 NOTE — Telephone Encounter (Signed)
Pt call stated she spoke with the pharmacy and insurance company, this med need get PA on it before insurance will cover. Pt was wondering while we wait on the PA, can she get something else call in to help with her BP, if this is ok, please call it in to [facility] [address] [place] [phone]) please call pt back, she is on her way to [place].

## 2015-11-27 NOTE — Telephone Encounter (Signed)
PA is not required.  Pharmacy is requesting authorizations from provider to fill medication since patient is out of state. [facility]

## 2015-11-29 IMAGING — CT CT ANGIO CHEST
2 of 9 series · 19 of 46 positions shown · IV contrast (Omni 300)
Comparison: Radiographs 09/20/2014

CLINICAL DATA: Cough and back pain for 3 weeks.

EXAM:
CT ANGIOGRAPHY CHEST WITH CONTRAST
TECHNIQUE: Multidetector CT imaging of the chest was performed using the
standard protocol during bolus administration of intravenous
contrast. Multiplanar CT image reconstructions and MIPs were
obtained to evaluate the vascular anatomy.
CONTRAST:  100mL OMNIPAQUE IOHEXOL 350 MG/ML SOLN

[Series 5: thins · axial · 0.59mm/px · z∈[-266,-58]mm · 16 of 236 slices shown]
[im 14/236  lung]
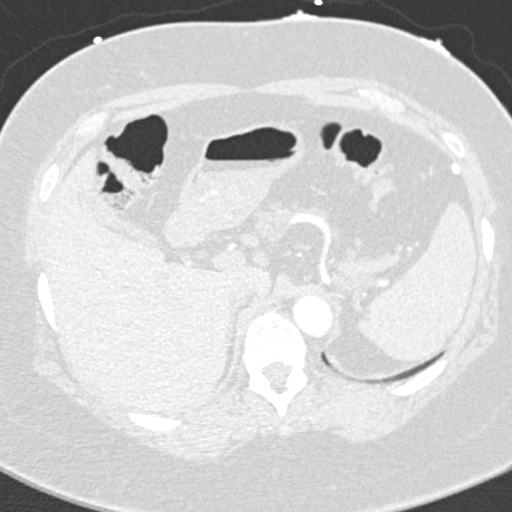
[im 27/236  soft-tissue]
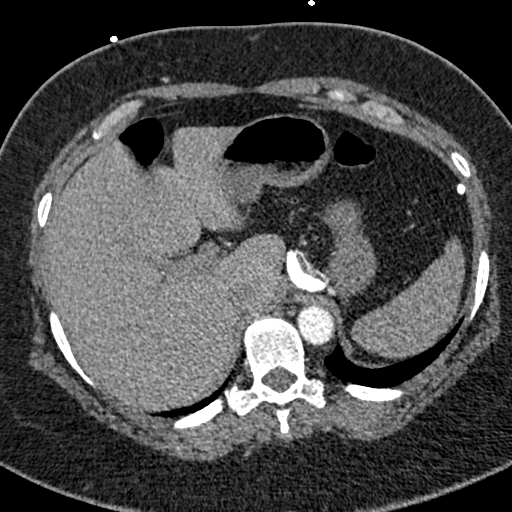
[im 40/236  lung]
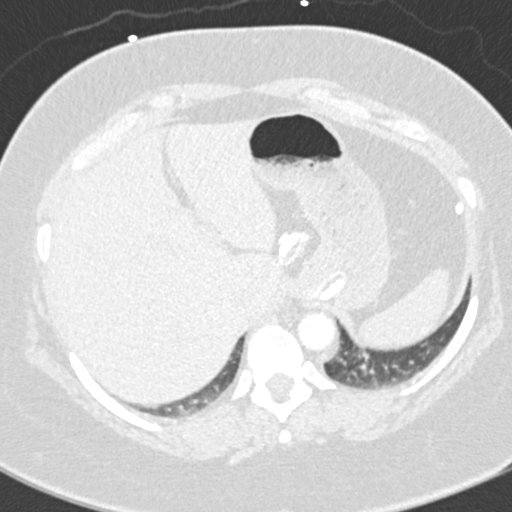
[im 53/236  soft-tissue]
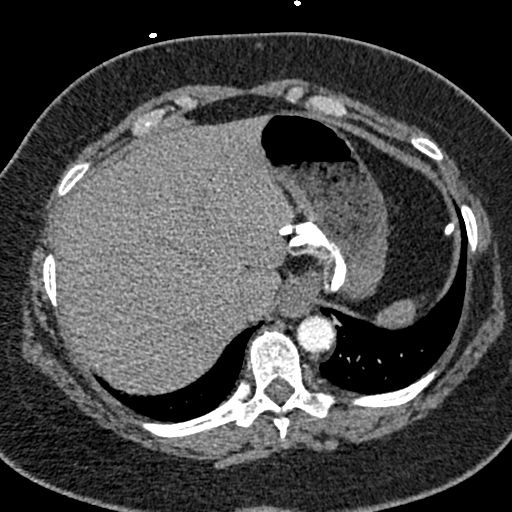
[im 66/236  lung]
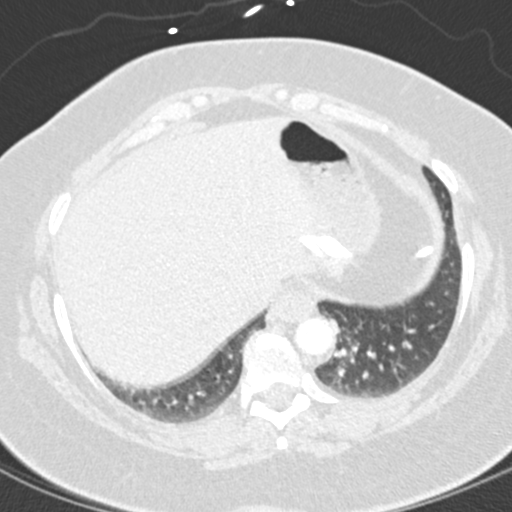
[im 79/236  soft-tissue]
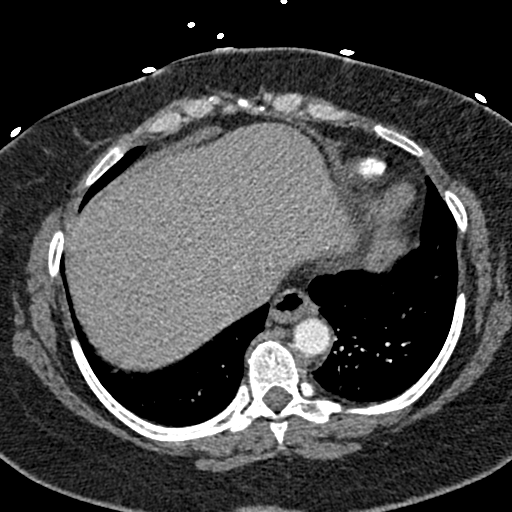
[im 92/236  lung]
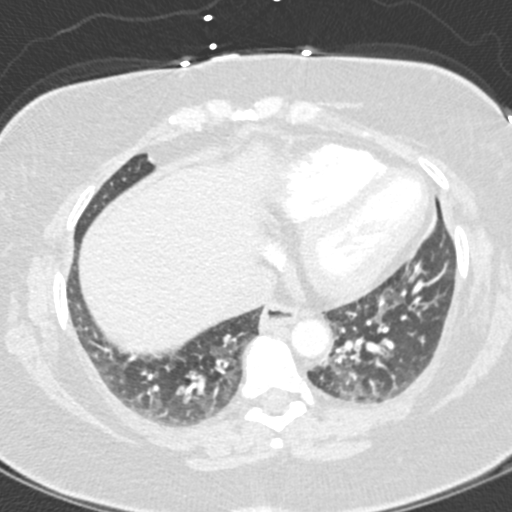
[im 105/236  soft-tissue]
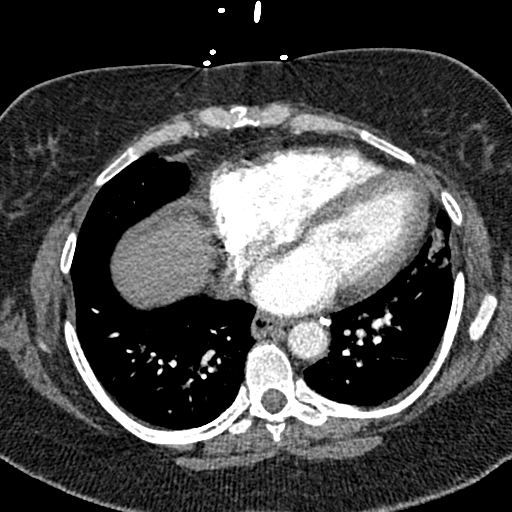
[im 131/236  lung]
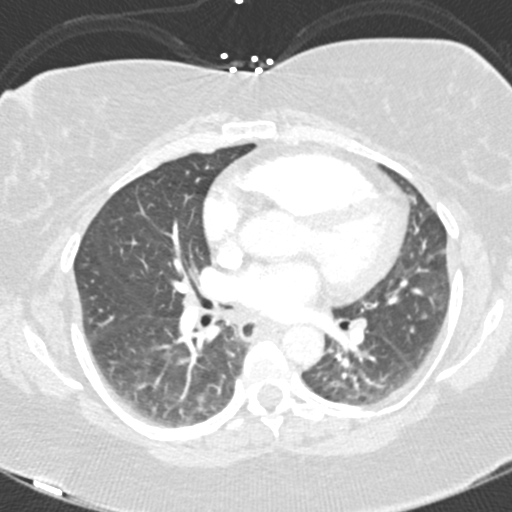
[im 144/236  soft-tissue]
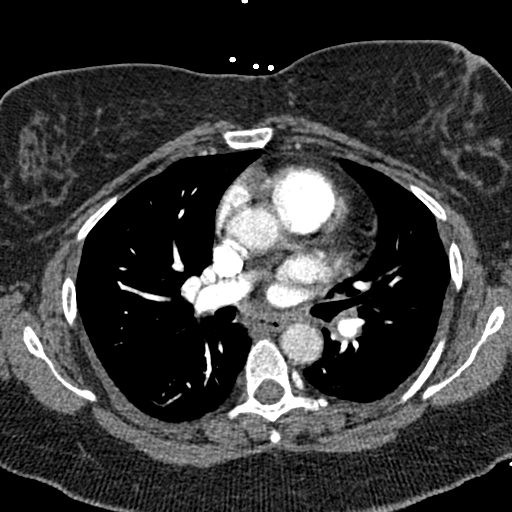
[im 157/236  lung]
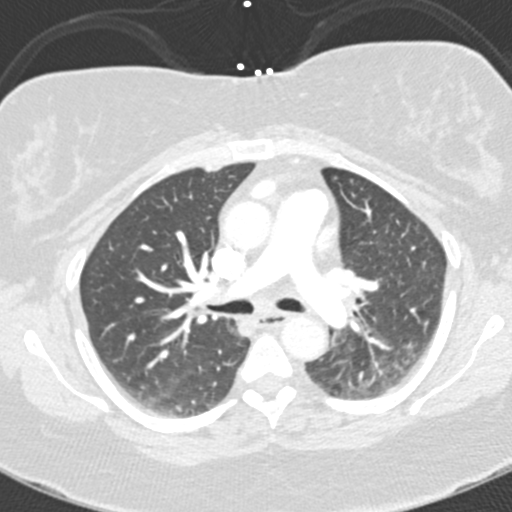
[im 170/236  soft-tissue]
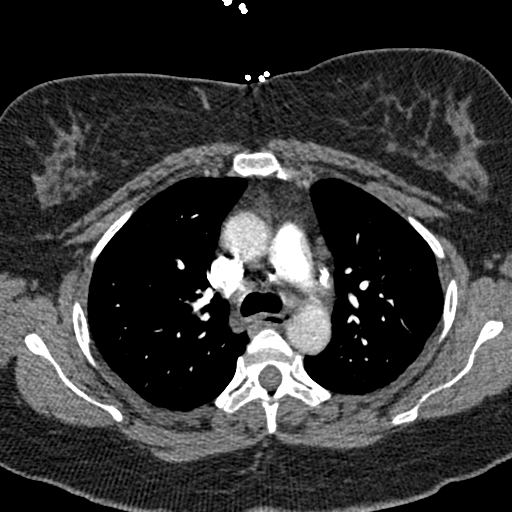
[im 183/236  lung]
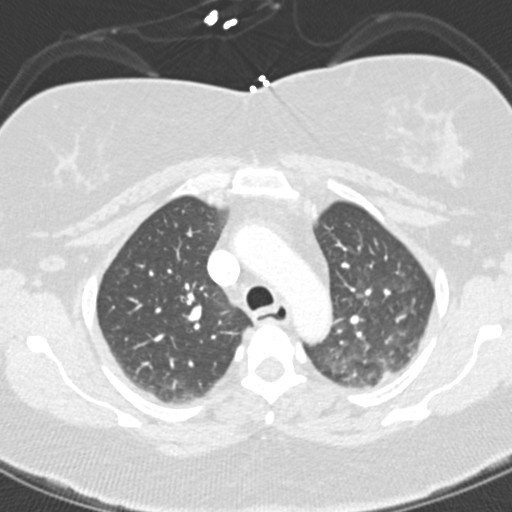
[im 196/236  soft-tissue]
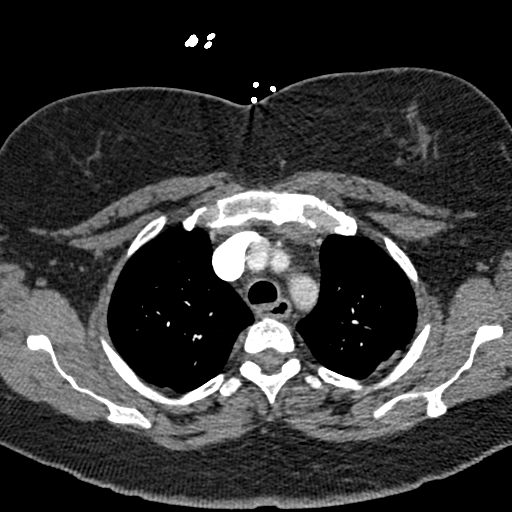
[im 209/236  lung]
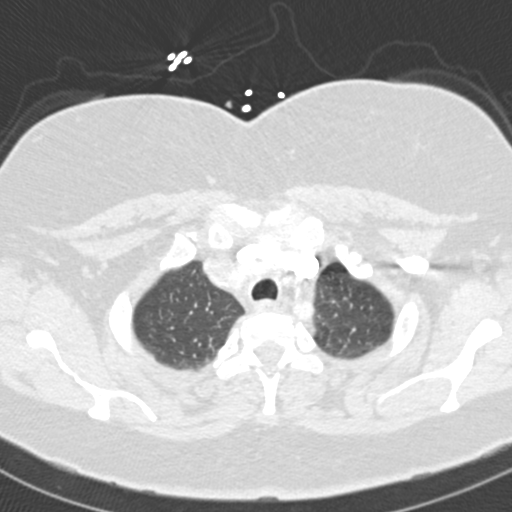
[im 222/236  soft-tissue]
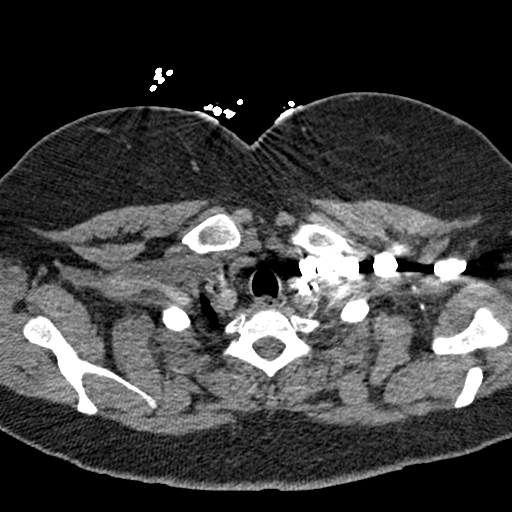

[Series 7: coronal mpr · coronal · 0.59mm/px · 3 of 109 slices shown]
[im 28/109  soft-tissue]
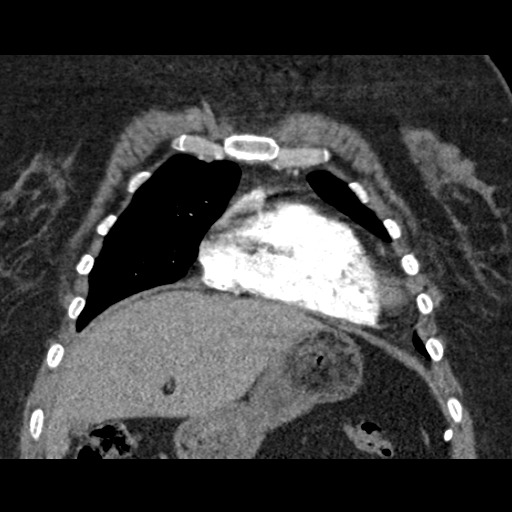
[im 55/109  soft-tissue]
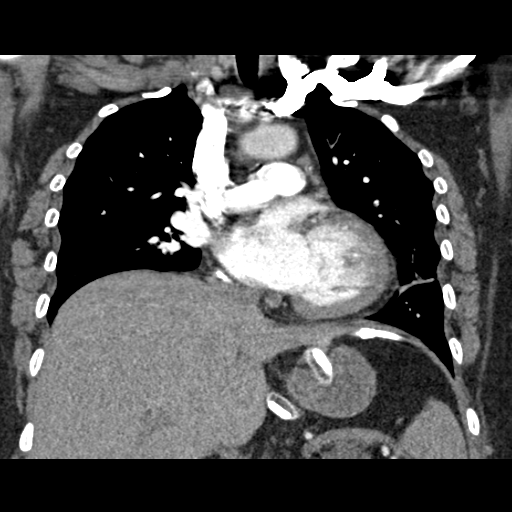
[im 82/109  soft-tissue]
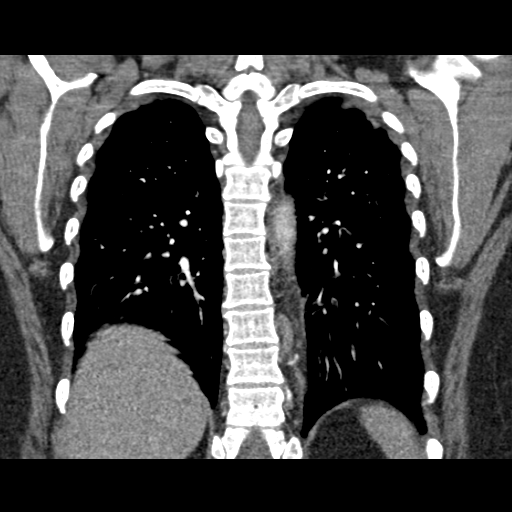

[19 of 46 positions shown; findings below may reference images not displayed]

FINDINGS: The nodular opacity in the left chest observed on recent radiography
is attributable to a benign focus of calcification in the left
anterior third rib cartilage at the costochondral junction. There is
no pulmonary nodule.

Mild ground-glass opacities are scattered bilaterally. This could
represent scattered air trapping. There is no dense airspace
consolidation. The airways are patent. There are no effusions. There
is no hilar or mediastinal adenopathy although a few physiologic
-sized nodes are present, perhaps reactive. Pulmonary vasculature is
well opacified with no evidence of a pulmonary embolism. The
thoracic aorta is normal in caliber and intact.

Included portions of the upper abdomen demonstrate an adjustable
gastric band but no significant upper abdominal abnormality. Small
hiatal hernia incidentally noted.

Review of the MIP images confirms the above findings.
IMPRESSION: 1. Benign calcification at the left anterior third costochondral
junction accounts for the nodular opacity observed on recent
radiography
2. Mild scattered ground-glass opacities in both lungs, perhaps due
to air trapping. Infectious or inflammatory etiologies are not
excluded.
3. Small hiatal hernia.
4. Negative for pulmonary embolism

## 2015-12-03 ENCOUNTER — Telehealth: Payer: Self-pay | Admitting: Emergency Medicine

## 2015-12-03 DIAGNOSIS — F329 Major depressive disorder, single episode, unspecified: Secondary | ICD-10-CM

## 2015-12-03 DIAGNOSIS — F32A Depression, unspecified: Secondary | ICD-10-CM

## 2015-12-03 MED ORDER — BUPROPION HCL ER (SR) 150 MG PO TB12: ORAL_TABLET | ORAL | 2 refills | Status: AC

## 2015-12-03 NOTE — Telephone Encounter (Signed)
Pt called and needs a prescription refill on buPROPion (WELLBUTRIN SR) 150 MG 12 hr tablet. Pharmacy is [facility]. Please follow up thanks.

## 2015-12-03 NOTE — Telephone Encounter (Signed)
Rx sent. Pt aware.  

## 2016-06-19 IMAGING — DX DG HIP (WITH OR WITHOUT PELVIS) 2-3V*L*
2 series · 2 of 2 positions shown · non-contrast
Comparison: None.

CLINICAL DATA: Left hip pain.  Shortness.

EXAM:
DG HIP (WITH OR WITHOUT PELVIS) 2-3V LEFT

[hip ap]
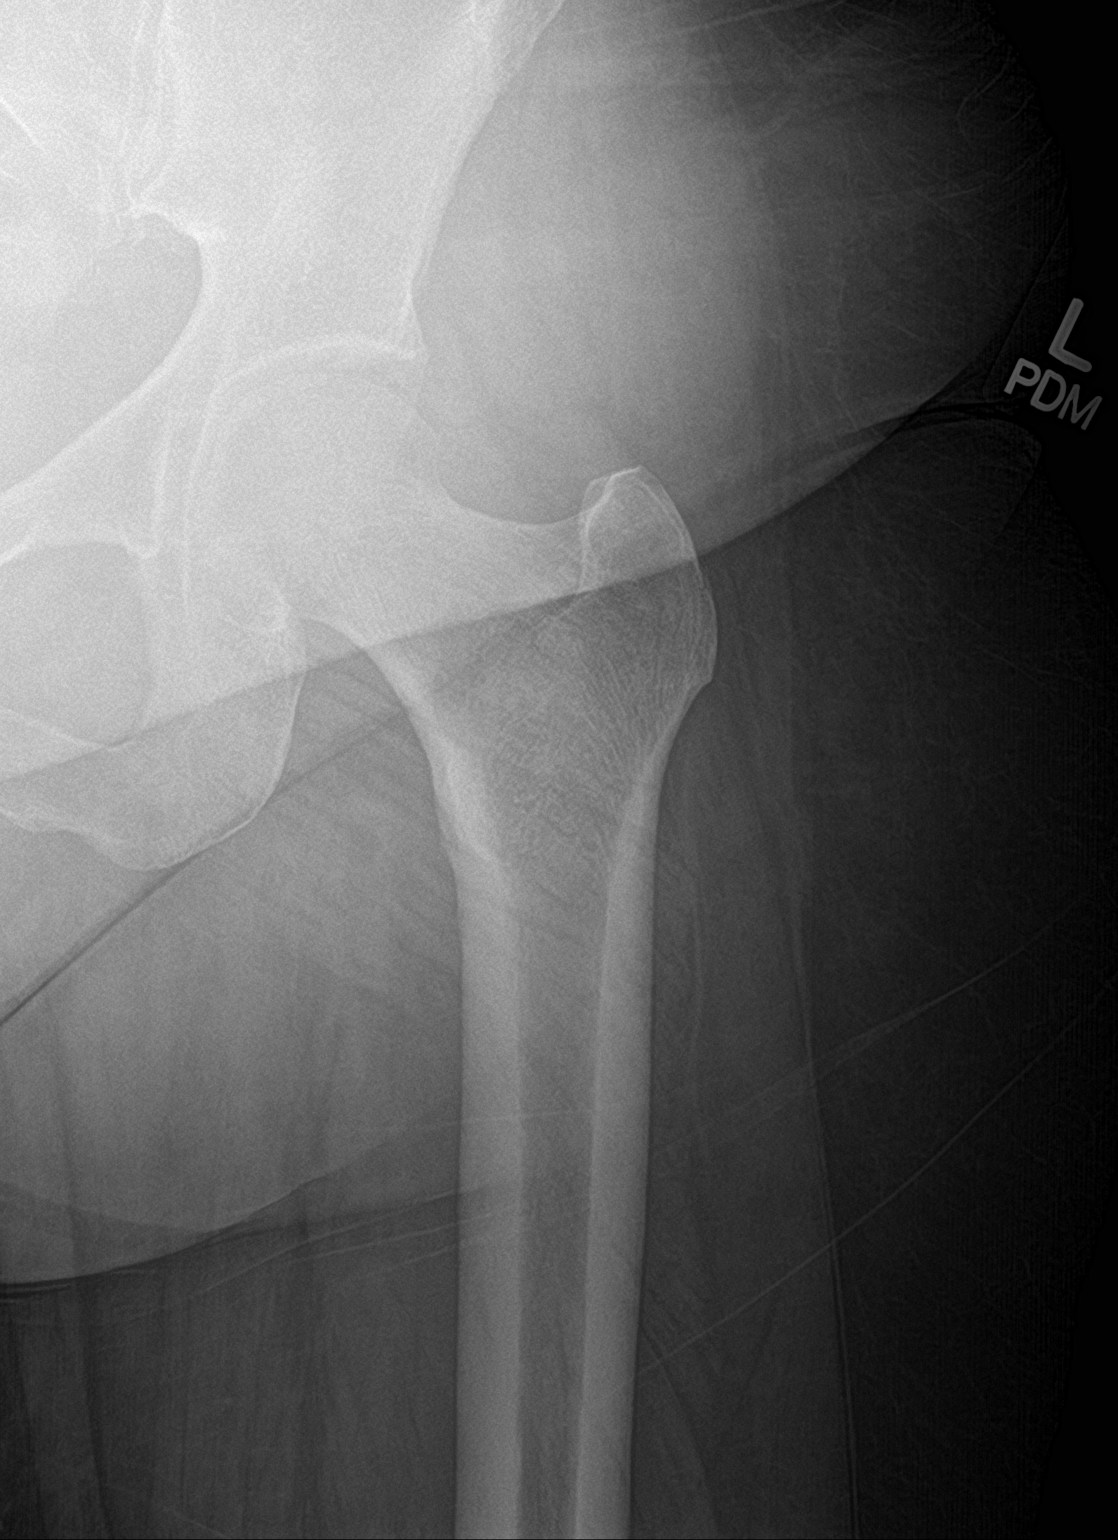

[hip lat]
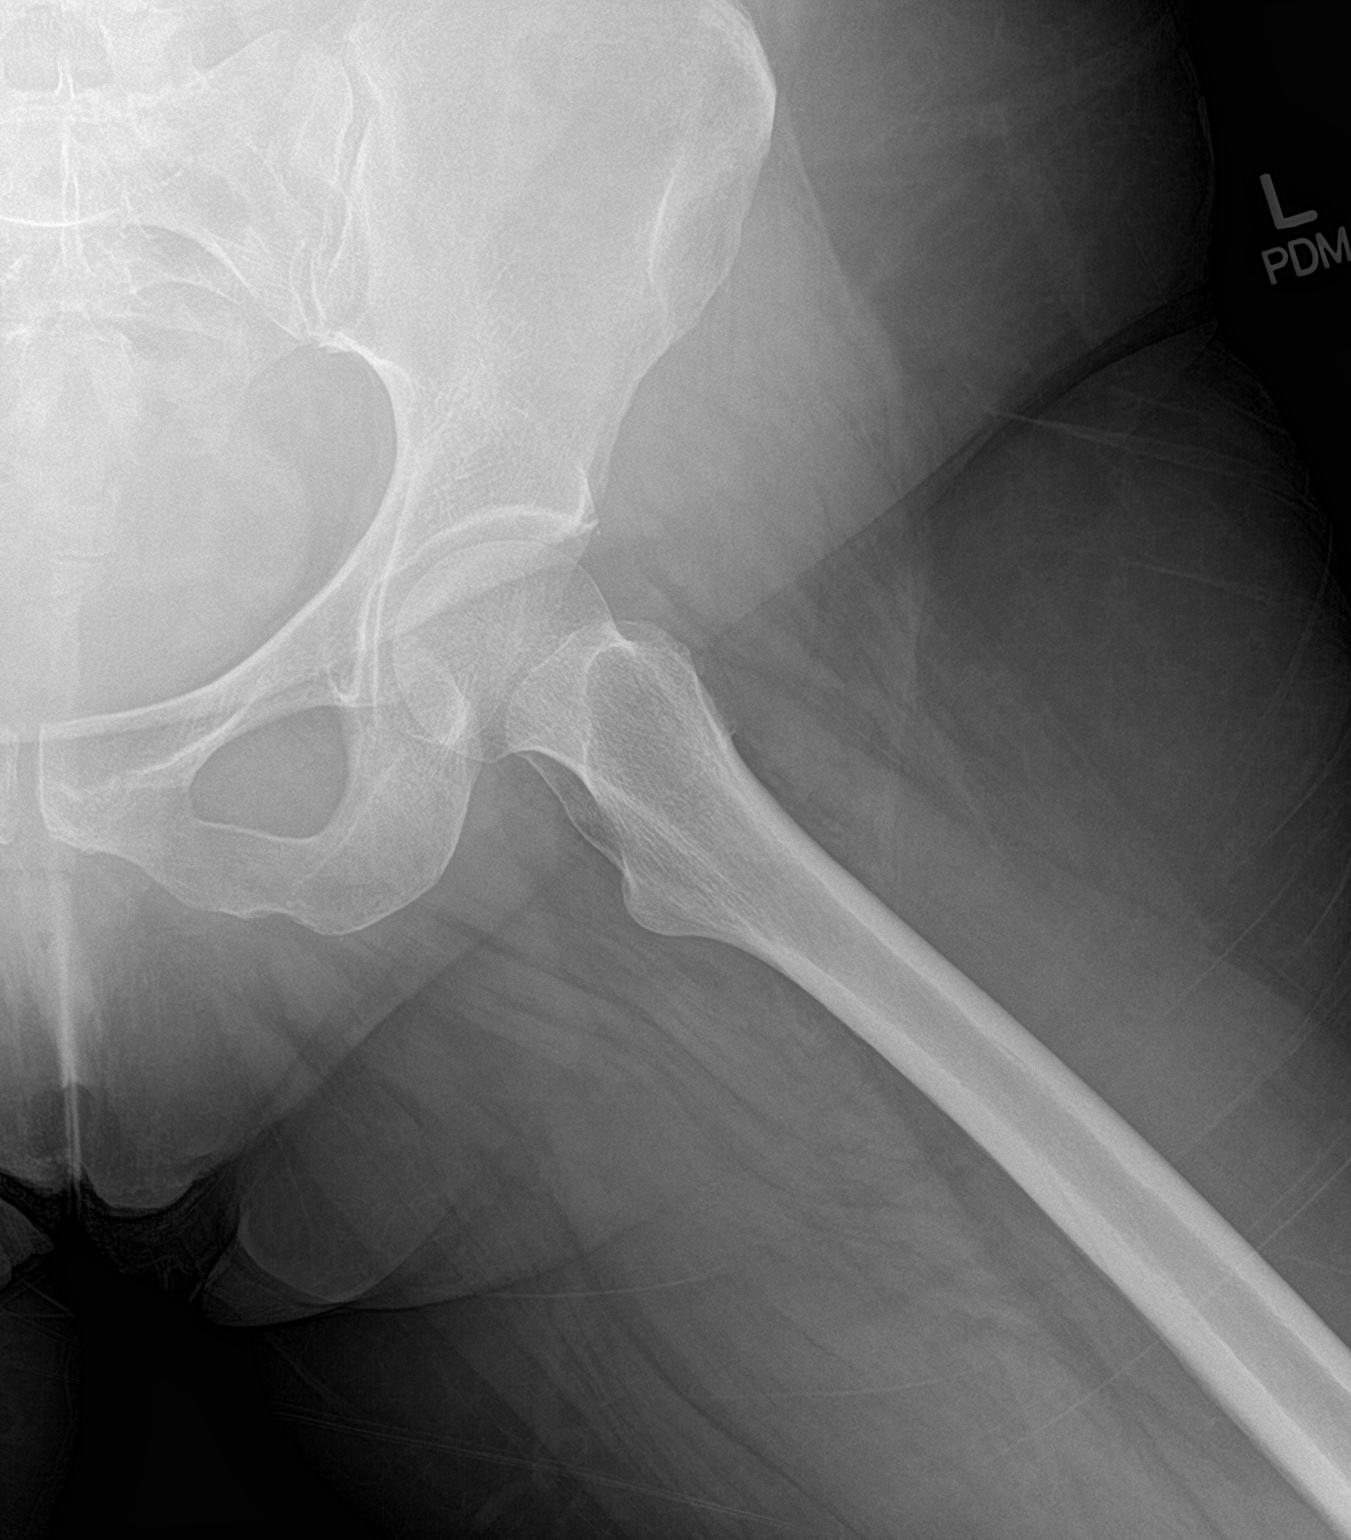

[2 of 2 positions shown; findings below may reference images not displayed]

FINDINGS: No acute bony or joint abnormality identified. No evidence of
fracture or dislocation.
IMPRESSION: No acute abnormality.

## 2016-09-17 ENCOUNTER — Encounter

## 2016-09-29 ENCOUNTER — Encounter

## 2016-09-29 ENCOUNTER — Inpatient Hospital Stay: Admit: 2016-09-29 | Payer: PRIVATE HEALTH INSURANCE | Attending: Emergency Medicine | Primary: Rheumatology

## 2016-09-29 ENCOUNTER — Ambulatory Visit

## 2016-09-29 DIAGNOSIS — Z1231 Encounter for screening mammogram for malignant neoplasm of breast: Secondary | ICD-10-CM

## 2019-07-21 NOTE — Telephone Encounter (Signed)
Left a message for patient to contact their PCP to send a referral to set up a consultation. As of today, 07/21/19 nothing has been received.

## 2019-08-24 ENCOUNTER — Encounter: Attending: Adult Health | Primary: Rheumatology

## 2019-09-21 ENCOUNTER — Inpatient Hospital Stay: Admit: 2019-09-21 | Primary: Rheumatology

## 2019-09-21 ENCOUNTER — Ambulatory Visit
Admit: 2019-09-21 | Discharge: 2019-09-21 | Payer: PRIVATE HEALTH INSURANCE | Attending: Adult Health | Primary: Rheumatology

## 2019-09-21 ENCOUNTER — Ambulatory Visit: Attending: Adult Health | Primary: Rheumatology

## 2019-09-21 DIAGNOSIS — B181 Chronic viral hepatitis B without delta-agent: Secondary | ICD-10-CM

## 2019-09-21 LAB — BASIC METABOLIC PANEL
Anion Gap: 7 mmol/L (ref 3.0–18)
BUN: 7 MG/DL (ref 7.0–18)
Bun/Cre Ratio: 10 — ABNORMAL LOW (ref 12–20)
CO2: 26 mmol/L (ref 21–32)
Calcium: 9.2 MG/DL (ref 8.5–10.1)
Chloride: 106 mmol/L (ref 100–111)
Creatinine: 0.73 MG/DL (ref 0.6–1.3)
EGFR IF NonAfrican American: >60 mL/min/{1.73_m2} (ref >60)
GFR African American: >60 mL/min/{1.73_m2} (ref >60)
Glucose: 98 mg/dL (ref 74–99)
Potassium: 4.1 mmol/L (ref 3.5–5.5)
Sodium: 139 mmol/L (ref 136–145)

## 2019-09-21 LAB — CBC WITH AUTO DIFFERENTIAL
Basophils %: 1 % (ref 0–2)
Basophils Absolute: 0 10*3/uL (ref 0.0–0.1)
Eosinophils %: 1 % (ref 0–5)
Eosinophils Absolute: 0.1 10*3/uL (ref 0.0–0.4)
Hematocrit: 37.8 % (ref 35.0–45.0)
Hemoglobin: 12.6 g/dL (ref 12.0–16.0)
Lymphocytes %: 17 % — ABNORMAL LOW (ref 21–52)
Lymphocytes Absolute: 1.2 10*3/uL (ref 0.9–3.6)
MCH: 31.5 PG (ref 24.0–34.0)
MCHC: 33.3 g/dL (ref 31.0–37.0)
MCV: 94.5 FL (ref 74.0–97.0)
MPV: 10.7 FL (ref 9.2–11.8)
Monocytes %: 7 % (ref 3–10)
Monocytes Absolute: 0.5 10*3/uL (ref 0.05–1.2)
Neutrophils %: 73 % (ref 40–73)
Neutrophils Absolute: 5.1 10*3/uL (ref 1.8–8.0)
Platelets: 387 10*3/uL (ref 135–420)
RBC: 4 M/uL — ABNORMAL LOW (ref 4.20–5.30)
RDW: 13.7 % (ref 11.6–14.5)
WBC: 7 10*3/uL (ref 4.6–13.2)

## 2019-09-21 LAB — HEPATIC FUNCTION PANEL
A-G Ratio: 1.2 (ref 0.8–1.7)
ALT (SGPT): 24 U/L (ref 13–56)
ALT: 24 U/L (ref 13–56)
AST (SGOT): 21 U/L (ref 10–38)
AST: 21 U/L (ref 10–38)
Albumin/Globulin Ratio: 1.2 (ref 0.8–1.7)
Albumin: 3.9 g/dL (ref 3.4–5.0)
Albumin: 3.9 g/dL (ref 3.4–5.0)
Alk. phosphatase: 88 U/L (ref 45–117)
Alkaline Phosphatase: 88 U/L (ref 45–117)
Bilirubin, Direct: 0.1 MG/DL (ref 0.0–0.2)
Bilirubin, direct: 0.1 MG/DL (ref 0.0–0.2)
Bilirubin, total: 0.5 MG/DL (ref 0.2–1.0)
Globulin: 3.2 g/dL (ref 2.0–4.0)
Globulin: 3.2 g/dL (ref 2.0–4.0)
Protein, total: 7.1 g/dL (ref 6.4–8.2)
Total Bilirubin: 0.5 MG/DL (ref 0.2–1.0)
Total Protein: 7.1 g/dL (ref 6.4–8.2)

## 2019-09-21 LAB — METABOLIC PANEL, BASIC
Anion gap: 7 mmol/L (ref 3.0–18)
BUN/Creatinine ratio: 10 — ABNORMAL LOW (ref 12–20)
BUN: 7 MG/DL (ref 7.0–18)
CO2: 26 mmol/L (ref 21–32)
Calcium: 9.2 MG/DL (ref 8.5–10.1)
Chloride: 106 mmol/L (ref 100–111)
Creatinine: 0.73 MG/DL (ref 0.6–1.3)
GFR est AA: >60 mL/min/{1.73_m2} (ref >60)
GFR est non-AA: >60 mL/min/{1.73_m2} (ref >60)
Glucose: 98 mg/dL (ref 74–99)
Potassium: 4.1 mmol/L (ref 3.5–5.5)
Sodium: 139 mmol/L (ref 136–145)

## 2019-09-21 LAB — CBC WITH AUTOMATED DIFF
ABS. BASOPHILS: 0 10*3/uL (ref 0.0–0.1)
ABS. EOSINOPHILS: 0.1 10*3/uL (ref 0.0–0.4)
ABS. LYMPHOCYTES: 1.2 10*3/uL (ref 0.9–3.6)
ABS. MONOCYTES: 0.5 10*3/uL (ref 0.05–1.2)
ABS. NEUTROPHILS: 5.1 10*3/uL (ref 1.8–8.0)
BASOPHILS: 1 % (ref 0–2)
EOSINOPHILS: 1 % (ref 0–5)
HCT: 37.8 % (ref 35.0–45.0)
HGB: 12.6 g/dL (ref 12.0–16.0)
LYMPHOCYTES: 17 % — ABNORMAL LOW (ref 21–52)
MCH: 31.5 PG (ref 24.0–34.0)
MCHC: 33.3 g/dL (ref 31.0–37.0)
MCV: 94.5 FL (ref 74.0–97.0)
MONOCYTES: 7 % (ref 3–10)
MPV: 10.7 FL (ref 9.2–11.8)
NEUTROPHILS: 73 % (ref 40–73)
PLATELET: 387 10*3/uL (ref 135–420)
RBC: 4 M/uL — ABNORMAL LOW (ref 4.20–5.30)
RDW: 13.7 % (ref 11.6–14.5)
WBC: 7 10*3/uL (ref 4.6–13.2)

## 2019-09-21 NOTE — Progress Notes (Signed)
[place]  [facility] [place] [facility]   [facility]      Thedore Mins, MD, [place], [place], [place]    Malachi Pro, MD, MPH      Arlyss Queen, PA-C    Genella Rife, ACNP-BC     April S Ashworth, AGPCNP-BC   Demetrios Isaacs, FNP-C    Marlyce Huge, AGPCNP-BC       [place]    at [facility]    [address] [place]  [address]    [phone]    FAX: [place]    at [facility]    [address], [place]  [address]    [phone]    FAX: [phone]       Patient Care Team:  Lynda Rainwater, MD as PCP - General (Rheumatology)      Problem List  Date Reviewed: 23-Sep-2019          Codes Class Noted    Psoriatic arthritis [facility]) ICD-10-CM: L40.50  ICD-9-CM: 696.0  2019-09-23        Psoriasis ICD-10-CM: L40.9  ICD-9-CM: 696.1  23-Sep-2019        Hypovitaminosis D ICD-10-CM: E55.9  ICD-9-CM: 268.9  Sep 23, 2019              The clinicians listed above have asked me to see Martha Porter in consultation regarding hepatitis B core antibody positive and its management. All medical records sent by the referring physicians were reviewed.    The patient is a 54 y.o. year old Caucasian female who has tested positive for HBV in 06/2019.     Risk factors for acquiring HBV are not apparent.  There was no history of acute icteric hepatitis.      Imaging of the liver was either not performed or the results are not available to me.      An assessment of liver fibrosis with biopsy or elastography has not been performed.      The patient has never received treatment for chronic HBV.      The patient does not have any symptoms which could be attributed to the liver disorder.    The patient is not experiencing the following symptoms which are commonly seen in this liver disorder:   fatigue, yellowing of the eyes or skin.     The patient  completes all daily activities without any functional limitations.      ASSESSMENT AND PLAN:  Anti-HBV Core Positive  The patient was positive for anti-HBV core but negative for HBsurface antigen. She is positive for anti-HBV surface in 06/2019.    This is consistent with previous exposure to HBV long ago and development of immunity      Another explanation is that anti-HBcore was simply a false positive.    The liver enzymes are normal  The patient does not have HBV.      Liver transaminases are normal. The ALP is normal. Liver function is normal. The platelet count is normal.    Based upon laboratory studies the patient does not appear to have significant liver linjury    Have performed laboratory testing to monitor liver function and degree of liver injury.  This has included   BMP, hepatic panel, CBC with platelet count.    Have performed serologic  and virologic testing of HBV.  Will perform serologic and virologic studies to assess for other causes of chronic liver disease.        Screening for Hepatocellular Carcinoma  HCC screening is not necessary if the patient has no evidence of cirrhosis.      Treatment of other medical problems in patients with chronic liver disease  There are no contraindications for the patient to take most medications that are necessary for treatment of other medical issues.    Counseling for alcohol in patients with chronic liver disease  The patient was counseled regarding alcohol consumption and the effect of alcohol on chronic liver disease.  The patient does not consume any significant amount of alcohol.    The patient does not have a chronic liver disease and does not have to be abstinent from alcohol.        Vaccinations   Vaccination for viral hepatitis A is recommended since the patient has no serologic evidence of previous exposure or vaccination with immunity.  Vaccination for viral hepatitis B is not needed.  The patient has serologic evidence of prior exposure or  vaccination with immunity.  Routine vaccinations against other bacterial and viral agents can be performed as indicated.  Annual flu vaccination should be administered if indicated.    ALLERGIES  No Known Allergies    MEDICATIONS  Current Outpatient Medications   Medication Sig   ??? FLUoxetine (PROzac) 10 mg capsule Take  by mouth daily.   ??? cetirizine (ZyrTEC) 10 mg tablet Take  by mouth.   ??? buPROPion XL (Wellbutrin XL) 150 mg tablet Take 150 mg by mouth every morning.   ??? cholecalciferol (Vitamin D3) 25 mcg (1,000 unit) cap Take  by mouth daily.   ??? dextroamphetamine-amphetamine (AdderalL) 10 mg tablet Take 10 mg by mouth.   ??? diphenhydrAMINE (BenadryL) 25 mg capsule Take 25 mg by mouth every six (6) hours as needed.   ??? folic acid (FOLVITE) 1 mg tablet Take  by mouth daily.   ??? ibuprofen (MOTRIN) 600 mg tablet Take  by mouth every six (6) hours as needed for Pain.   ??? acetaminophen (TylenoL) 325 mg tablet Take  by mouth every four (4) hours as needed for Pain.   ??? losartan (COZAAR) 50 mg tablet Take  by mouth daily.   ??? methotrexate (RHEUMATREX) 2.5 mg tablet Take  by mouth.   ??? tiZANidine (ZANAFLEX) 4 mg tablet Take 4 mg by mouth three (3) times daily as needed for Muscle Spasm(s).   ??? Biotin 2,500 mcg cap Take  by mouth.   ??? HYDROcodone-acetaminophen (NORCO) 7.5-325 mg per tablet Take 1-2 Tabs by mouth nightly as needed for Pain. Max Daily Amount: 2 Tabs.   ??? lisinopril (PRINIVIL, ZESTRIL) 20 mg tablet Take  by mouth daily.   ??? escitalopram oxalate (LEXAPRO) 20 mg tablet Take 20 mg by mouth daily.     No current facility-administered medications for this visit.        SYSTEM REVIEW NOT RELATED TO LIVER DISEASE OR REVIEWED ABOVE:  Constitution systems: Negative for fever, chills, weight gain, weight loss.   Eyes: Negative for visual changes.  ENT: Negative for sore throat, painful swallowing.   Respiratory: Negative for cough, hemoptysis, SOB.   Cardiology: Negative for chest pain, palpitations.  GI:  Negative  for constipation or diarrhea.  GU: Negative for urinary frequency, dysuria, hematuria, nocturia.   Skin: Negative for rash.  Hematology: Negative for easy bruising, blood clots.  Musculo-skelatal: Negative for back pain, muscle pain, weakness.  Neurologic: Negative for headaches, dizziness, vertigo, memory problems not related to HE.  Psychology: Negative for anxiety, depression.       FAMILY HISTORY:  The father died of cancer.    The mother Has/had the following chronic disease(s): CAD, AAA, DM, COPD    There is no family history of liver disease.    There is no family history of immune disorders.    SOCIAL HISTORY:  The patient is divorced.    The patient has 2 children, and 2 grandchildren.    The children have been vaccinated for HBV.  The patient stopped using tobacco products in 2000.    The patient has never consumed significant amounts of alcohol.    The patient currently works full time as a Pharmacist, hospital.        PHYSICAL EXAMINATION:  Visit Vitals  BP (!) 129/90 (BP 1 Location: Left upper arm, BP Patient Position: Sitting, BP Cuff Size: Small adult)   Pulse 75   Temp 97.8 ??F (36.6 ??C)   Ht _0  (1.575 m)   Wt 168 lb (76.2 kg)   SpO2 98%   BMI 30.73 kg/m??     General: No acute distress.   Eyes: Sclera anicteric.   ENT: No oral lesions.  Thyroid normal.  Nodes: No adenopathy.   Skin: No spider angiomata.  No jaundice.  No palmar erythema.  Respiratory: Lungs clear to auscultation.   Cardiovascular: Regular heart rate.  No murmurs.  No JVD.  Abdomen: Soft non-tender.  Liver size normal to percussion/palpation.  Spleen not palpable. No obvious ascites.  Extremities: No edema.  No muscle wasting.  No gross arthritic changes.  Neurologic: Alert and oriented.  Cranial nerves grossly intact.  No asterixis.    LABORATORY STUDIES:  [facility] 09/21/2019   WBC 4.6 - 13.2 K/uL 7.0   ANC 1.8 - 8.0 K/UL 5.1   HGB 12.0 - 16.0 g/dL 12.6   PLT 135 - 420 K/uL 387   AST 10 - 38 U/L 21    ALT 13 - 56 U/L 24   Alk Phos 45 - 117 U/L 88   Bili, Total 0.2 - 1.0 MG/DL 0.5   Bili, Direct 0.0 - 0.2 MG/DL 0.1   Albumin 3.4 - 5.0 g/dL 3.9   BUN 7.0 - 18 MG/DL 7   Creat 0.6 - 1.3 MG/DL 0.73   Na 136 - 145 mmol/L 139   K 3.5 - 5.5 mmol/L 4.1   Cl 100 - 111 mmol/L 106   CO2 21 - 32 mmol/L 26   Glucose 74 - 99 mg/dL 98       SEROLOGIES:  Serologies Latest Ref Rng & Units 09/21/2019   Hep A Ab, Total Negative   Negative   Hep B Surface Ag <1.00 Index <0.10   Hep B Surface Ag Interp NEG   Negative   Hep B Core Ab, Total Negative   Negative   Hep B Surface Ab >10.0 mIU/mL 185.45   Hep B Surface Ab Interp POS   Positive     LIVER HISTOLOGY:  Not available or performed    ENDOSCOPIC PROCEDURES:  Not available or performed    RADIOLOGY:  Not available or performed    OTHER TESTING:  Not available or performed      FOLLOW-UP:  All of the issues listed above in the Assessment and Plan were discussed with the  patient.  All questions were answered.  The patient expressed a clear understanding of the above.    No follow-up at [facility] is needed.  I would be glad to see the patient back for follow-up at any time in the future if the clinical situation changes.      Marlyce Huge, AGPCNP-BC  [facility]  [address], [place]  [address]  [phone]

## 2019-09-21 NOTE — Progress Notes (Signed)
No additional comment

## 2019-09-21 NOTE — Progress Notes (Signed)
[facility]  Martha Porter [facility] [place] [facility]   [facility]      Thedore Mins, MD, [place], [place], [place]    Martha Pro, MD, MPH      Martha Queen, PA-C    Martha Porter, ACNP-BC     Martha Porter, AGPCNP-BC   Martha Isaacs, FNP-C    Martha Porter, AGPCNP-BC       [place]    at [facility]    [address] [place]  [address]    [phone]    FAX: [place]    at [facility]    [address], [place]  [address]    [phone]    FAX: [phone]       Patient Care Team:  Lynda Rainwater, MD as PCP - General (Rheumatology)      Problem List  Date Reviewed: 23-Sep-2019          Codes Class Noted    Psoriatic arthritis [facility]) ICD-10-CM: L40.50  ICD-9-CM: 696.0  2019-09-23        Psoriasis ICD-10-CM: L40.9  ICD-9-CM: 696.1  23-Sep-2019        Hypovitaminosis D ICD-10-CM: E55.9  ICD-9-CM: 268.9  Sep 23, 2019              The clinicians listed above have asked me to see Martha Porter in consultation regarding hepatitis B core antibody positive and its management. All medical records sent by the referring physicians were reviewed.    The patient is a 54 y.o. year old Caucasian female who has tested positive for HBV in 06/2019.     Risk factors for acquiring HBV are not apparent.  There was no history of acute icteric hepatitis.      Imaging of the liver was either not performed or the results are not available to me.      An assessment of liver fibrosis with biopsy or elastography has not been performed.      The patient has never received treatment for chronic HBV.      The patient does not have any symptoms which could be attributed to the liver disorder.    The patient is not experiencing the following symptoms which are commonly seen in this liver disorder:   fatigue, yellowing of the eyes or skin.     The patient  completes all daily activities without any functional limitations.      ASSESSMENT AND PLAN:  Anti-HBV Core Positive  The patient was positive for anti-HBV core but negative for HBsurface antigen. She is positive for anti-HBV surface in 06/2019.    This is consistent with previous exposure to HBV long ago and development of immunity      Another explanation is that anti-HBcore was simply a false positive.    The liver enzymes are normal  The patient does not have HBV.      Liver transaminases are normal. The ALP is normal. Liver function is normal. The platelet count is normal.    Based upon laboratory studies the patient does not appear to have significant liver linjury    Have performed laboratory testing to monitor liver function and degree of liver injury.  This has included   BMP, hepatic panel, CBC with platelet count.    Have performed serologic  and virologic testing of HBV.  Will perform serologic and virologic studies to assess for other causes of chronic liver disease.        Screening for Hepatocellular Carcinoma  HCC screening is not necessary if the patient has no evidence of cirrhosis.      Treatment of other medical problems in patients with chronic liver disease  There are no contraindications for the patient to take most medications that are necessary for treatment of other medical issues.    Counseling for alcohol in patients with chronic liver disease  The patient was counseled regarding alcohol consumption and the effect of alcohol on chronic liver disease.  The patient does not consume any significant amount of alcohol.    The patient does not have a chronic liver disease and does not have to be abstinent from alcohol.        Vaccinations   Vaccination for viral hepatitis A is recommended since the patient has no serologic evidence of previous exposure or vaccination with immunity.  Vaccination for viral hepatitis B is not needed.  The patient has serologic evidence of prior exposure or  vaccination with immunity.  Routine vaccinations against other bacterial and viral agents can be performed as indicated.  Annual flu vaccination should be administered if indicated.    ALLERGIES  No Known Allergies    MEDICATIONS  Current Outpatient Medications   Medication Sig   ??? FLUoxetine (PROzac) 10 mg capsule Take  by mouth daily.   ??? cetirizine (ZyrTEC) 10 mg tablet Take  by mouth.   ??? buPROPion XL (Wellbutrin XL) 150 mg tablet Take 150 mg by mouth every morning.   ??? cholecalciferol (Vitamin D3) 25 mcg (1,000 unit) cap Take  by mouth daily.   ??? dextroamphetamine-amphetamine (AdderalL) 10 mg tablet Take 10 mg by mouth.   ??? diphenhydrAMINE (BenadryL) 25 mg capsule Take 25 mg by mouth every six (6) hours as needed.   ??? folic acid (FOLVITE) 1 mg tablet Take  by mouth daily.   ??? ibuprofen (MOTRIN) 600 mg tablet Take  by mouth every six (6) hours as needed for Pain.   ??? acetaminophen (TylenoL) 325 mg tablet Take  by mouth every four (4) hours as needed for Pain.   ??? losartan (COZAAR) 50 mg tablet Take  by mouth daily.   ??? methotrexate (RHEUMATREX) 2.5 mg tablet Take  by mouth.   ??? tiZANidine (ZANAFLEX) 4 mg tablet Take 4 mg by mouth three (3) times daily as needed for Muscle Spasm(s).   ??? Biotin 2,500 mcg cap Take  by mouth.   ??? HYDROcodone-acetaminophen (NORCO) 7.5-325 mg per tablet Take 1-2 Tabs by mouth nightly as needed for Pain. Max Daily Amount: 2 Tabs.   ??? lisinopril (PRINIVIL, ZESTRIL) 20 mg tablet Take  by mouth daily.   ??? escitalopram oxalate (LEXAPRO) 20 mg tablet Take 20 mg by mouth daily.     No current facility-administered medications for this visit.        SYSTEM REVIEW NOT RELATED TO LIVER DISEASE OR REVIEWED ABOVE:  Constitution systems: Negative for fever, chills, weight gain, weight loss.   Eyes: Negative for visual changes.  ENT: Negative for sore throat, painful swallowing.   Respiratory: Negative for cough, hemoptysis, SOB.   Cardiology: Negative for chest pain, palpitations.  GI:  Negative  for constipation or diarrhea.  GU: Negative for urinary frequency, dysuria, hematuria, nocturia.   Skin: Negative for rash.  Hematology: Negative for easy bruising, blood clots.  Musculo-skelatal: Negative for back pain, muscle pain, weakness.  Neurologic: Negative for headaches, dizziness, vertigo, memory problems not related to HE.  Psychology: Negative for anxiety, depression.       FAMILY HISTORY:  The father died of cancer.    The mother Has/had the following chronic disease(s): CAD, AAA, DM, COPD    There is no family history of liver disease.    There is no family history of immune disorders.    SOCIAL HISTORY:  The patient is divorced.    The patient has 2 children, and 2 grandchildren.    The children have been vaccinated for HBV.  The patient stopped using tobacco products in 2000.    The patient has never consumed significant amounts of alcohol.    The patient currently works full time as a Pharmacist, hospital.        PHYSICAL EXAMINATION:  Visit Vitals  BP (!) 129/90 (BP 1 Location: Left upper arm, BP Patient Position: Sitting, BP Cuff Size: Small adult)   Pulse 75   Temp 97.8 ??F (36.6 ??C)   Ht _0  (1.575 m)   Wt 168 lb (76.2 kg)   SpO2 98%   BMI 30.73 kg/m??     General: No acute distress.   Eyes: Sclera anicteric.   ENT: No oral lesions.  Thyroid normal.  Nodes: No adenopathy.   Skin: No spider angiomata.  No jaundice.  No palmar erythema.  Respiratory: Lungs clear to auscultation.   Cardiovascular: Regular heart rate.  No murmurs.  No JVD.  Abdomen: Soft non-tender.  Liver size normal to percussion/palpation.  Spleen not palpable. No obvious ascites.  Extremities: No edema.  No muscle wasting.  No gross arthritic changes.  Neurologic: Alert and oriented.  Cranial nerves grossly intact.  No asterixis.    LABORATORY STUDIES:  [facility] 09/21/2019   WBC 4.6 - 13.2 K/uL 7.0   ANC 1.8 - 8.0 K/UL 5.1   HGB 12.0 - 16.0 g/dL 12.6   PLT 135 - 420 K/uL 387   AST 10 - 38 U/L 21    ALT 13 - 56 U/L 24   Alk Phos 45 - 117 U/L 88   Bili, Total 0.2 - 1.0 MG/DL 0.5   Bili, Direct 0.0 - 0.2 MG/DL 0.1   Albumin 3.4 - 5.0 g/dL 3.9   BUN 7.0 - 18 MG/DL 7   Creat 0.6 - 1.3 MG/DL 0.73   Na 136 - 145 mmol/L 139   K 3.5 - 5.5 mmol/L 4.1   Cl 100 - 111 mmol/L 106   CO2 21 - 32 mmol/L 26   Glucose 74 - 99 mg/dL 98       SEROLOGIES:  Serologies Latest Ref Rng & Units 09/21/2019   Hep A Ab, Total Negative   Negative   Hep B Surface Ag <1.00 Index <0.10   Hep B Surface Ag Interp NEG   Negative   Hep B Core Ab, Total Negative   Negative   Hep B Surface Ab >10.0 mIU/mL 185.45   Hep B Surface Ab Interp POS   Positive     LIVER HISTOLOGY:  Not available or performed    ENDOSCOPIC PROCEDURES:  Not available or performed    RADIOLOGY:  Not available or performed    OTHER TESTING:  Not available or performed      FOLLOW-UP:  All of the issues listed above in the Assessment and Plan were discussed with the  patient.  All questions were answered.  The patient expressed a clear understanding of the above.    No follow-up at [facility] is needed.  I would be glad to see the patient back for follow-up at any time in the future if the clinical situation changes.      Martha Porter, AGPCNP-BC  [facility]  [address], [place]  [address]  [phone]

## 2019-09-22 LAB — HEPATITIS B, QT, PCR
HBV IU/ML: NOT DETECTED IU/mL
HBV IU/mL: NOT DETECTED IU/mL

## 2019-09-22 LAB — HEPATITIS B SURFACE ANTIGEN: Hepatitis B Surface Ag: <0.1 Index (ref <1.00)

## 2019-09-22 LAB — HEPATITIS B SURFACE ANTIBODY
Hep B S Ab Interp: POSITIVE
Hep B S Ab: 185.45 m[IU]/mL (ref >10.0)

## 2019-09-22 LAB — HEPATITIS B CORE ANTIBODY, TOTAL: Hep B Core Total Ab: NEGATIVE

## 2019-09-22 LAB — HEP A AB, TOTAL
HEP A AB, TOTAL, 006726: NEGATIVE
Hep A Ab, total: NEGATIVE

## 2019-09-22 LAB — HEP B SURFACE AG
Hep B surface Ag Interp.: NEGATIVE
Hepatitis B surface Ag: <0.1 Index (ref <1.00)

## 2019-09-22 LAB — HEP B SURFACE AB
Hep B surface Ab Interp.: POSITIVE
Hepatitis B surface Ab: 185.45 m[IU]/mL (ref >10.0)

## 2019-09-22 LAB — HEPATITIS B CORE AB, TOTAL: Hep B Core Ab, total: NEGATIVE
